# Patient Record
Sex: Female | Born: 1945 | Race: White | Hispanic: No | Marital: Single | Smoking: Never smoker
Health system: Southern US, Community
[De-identification: ages and names within clinical notes are randomized; demographics above are authoritative.]

## PROBLEM LIST (undated history)

## (undated) DIAGNOSIS — E785 Hyperlipidemia, unspecified: Secondary | ICD-10-CM

## (undated) DIAGNOSIS — E039 Hypothyroidism, unspecified: Secondary | ICD-10-CM

## (undated) DIAGNOSIS — I10 Essential (primary) hypertension: Secondary | ICD-10-CM

---

## 2021-07-25 ENCOUNTER — Other Ambulatory Visit: Payer: Self-pay | Admitting: Internal Medicine

## 2021-07-25 DIAGNOSIS — R1032 Left lower quadrant pain: Secondary | ICD-10-CM

## 2021-07-27 ENCOUNTER — Ambulatory Visit
Admission: RE | Admit: 2021-07-27 | Discharge: 2021-07-27 | Disposition: A | Discharge status: Home / Self Care | Payer: Medicare PPO | Source: Ambulatory Visit | Attending: Internal Medicine | Admitting: Internal Medicine

## 2021-07-27 DIAGNOSIS — R1032 Left lower quadrant pain: Secondary | ICD-10-CM

## 2021-07-28 ENCOUNTER — Other Ambulatory Visit: Payer: Self-pay | Admitting: Internal Medicine

## 2021-07-28 DIAGNOSIS — R1030 Lower abdominal pain, unspecified: Secondary | ICD-10-CM

## 2021-08-02 ENCOUNTER — Inpatient Hospital Stay: Admission: RE | Admit: 2021-08-02 | Payer: Medicare PPO | Source: Ambulatory Visit

## 2021-08-04 ENCOUNTER — Ambulatory Visit
Admission: RE | Admit: 2021-08-04 | Discharge: 2021-08-04 | Disposition: A | Discharge status: Home / Self Care | Payer: Medicare PPO | Source: Ambulatory Visit | Attending: Internal Medicine | Admitting: Internal Medicine

## 2021-08-04 DIAGNOSIS — R1030 Lower abdominal pain, unspecified: Secondary | ICD-10-CM

## 2021-08-04 MED ORDER — IOPAMIDOL (ISOVUE-300) INJECTION 61%
100.0000 mL | Freq: Once | INTRAVENOUS | Status: AC | PRN
  Administered 2021-08-04: 100 mL via INTRAVENOUS

## 2021-08-19 ENCOUNTER — Other Ambulatory Visit: Payer: Medicare PPO

## 2022-01-30 DIAGNOSIS — G4719 Other hypersomnia: Secondary | ICD-10-CM | POA: Diagnosis not present

## 2022-01-30 DIAGNOSIS — E039 Hypothyroidism, unspecified: Secondary | ICD-10-CM | POA: Diagnosis not present

## 2022-02-09 DIAGNOSIS — H25812 Combined forms of age-related cataract, left eye: Secondary | ICD-10-CM | POA: Diagnosis not present

## 2022-02-09 DIAGNOSIS — H2512 Age-related nuclear cataract, left eye: Secondary | ICD-10-CM | POA: Diagnosis not present

## 2022-02-09 DIAGNOSIS — H269 Unspecified cataract: Secondary | ICD-10-CM | POA: Diagnosis not present

## 2022-02-09 DIAGNOSIS — H25012 Cortical age-related cataract, left eye: Secondary | ICD-10-CM | POA: Diagnosis not present

## 2022-02-09 DIAGNOSIS — H52222 Regular astigmatism, left eye: Secondary | ICD-10-CM | POA: Diagnosis not present

## 2022-02-09 DIAGNOSIS — H25042 Posterior subcapsular polar age-related cataract, left eye: Secondary | ICD-10-CM | POA: Diagnosis not present

## 2022-02-14 DIAGNOSIS — E039 Hypothyroidism, unspecified: Secondary | ICD-10-CM | POA: Diagnosis not present

## 2022-02-14 DIAGNOSIS — E7800 Pure hypercholesterolemia, unspecified: Secondary | ICD-10-CM | POA: Diagnosis not present

## 2022-02-17 DIAGNOSIS — Z961 Presence of intraocular lens: Secondary | ICD-10-CM | POA: Diagnosis not present

## 2022-02-17 DIAGNOSIS — H353122 Nonexudative age-related macular degeneration, left eye, intermediate dry stage: Secondary | ICD-10-CM | POA: Diagnosis not present

## 2022-02-28 DIAGNOSIS — F4321 Adjustment disorder with depressed mood: Secondary | ICD-10-CM | POA: Diagnosis not present

## 2022-03-06 DIAGNOSIS — E559 Vitamin D deficiency, unspecified: Secondary | ICD-10-CM | POA: Diagnosis not present

## 2022-03-06 DIAGNOSIS — K219 Gastro-esophageal reflux disease without esophagitis: Secondary | ICD-10-CM | POA: Diagnosis not present

## 2022-03-06 DIAGNOSIS — I10 Essential (primary) hypertension: Secondary | ICD-10-CM | POA: Diagnosis not present

## 2022-03-06 DIAGNOSIS — M810 Age-related osteoporosis without current pathological fracture: Secondary | ICD-10-CM | POA: Diagnosis not present

## 2022-03-06 DIAGNOSIS — E039 Hypothyroidism, unspecified: Secondary | ICD-10-CM | POA: Diagnosis not present

## 2022-03-07 DIAGNOSIS — G4733 Obstructive sleep apnea (adult) (pediatric): Secondary | ICD-10-CM | POA: Diagnosis not present

## 2022-03-08 DIAGNOSIS — G4733 Obstructive sleep apnea (adult) (pediatric): Secondary | ICD-10-CM | POA: Diagnosis not present

## 2022-03-09 DIAGNOSIS — H269 Unspecified cataract: Secondary | ICD-10-CM | POA: Diagnosis not present

## 2022-03-09 DIAGNOSIS — H25011 Cortical age-related cataract, right eye: Secondary | ICD-10-CM | POA: Diagnosis not present

## 2022-03-09 DIAGNOSIS — H25811 Combined forms of age-related cataract, right eye: Secondary | ICD-10-CM | POA: Diagnosis not present

## 2022-03-09 DIAGNOSIS — H2511 Age-related nuclear cataract, right eye: Secondary | ICD-10-CM | POA: Diagnosis not present

## 2022-03-29 ENCOUNTER — Other Ambulatory Visit: Payer: Self-pay

## 2022-03-29 ENCOUNTER — Emergency Department (HOSPITAL_BASED_OUTPATIENT_CLINIC_OR_DEPARTMENT_OTHER)
Admission: EM | Admit: 2022-03-29 | Discharge: 2022-03-29 | Disposition: A | Discharge status: Home / Self Care | Payer: Medicare PPO | Attending: Emergency Medicine | Admitting: Emergency Medicine

## 2022-03-29 ENCOUNTER — Emergency Department (HOSPITAL_BASED_OUTPATIENT_CLINIC_OR_DEPARTMENT_OTHER): Payer: Medicare PPO | Admitting: Radiology

## 2022-03-29 ENCOUNTER — Encounter (HOSPITAL_BASED_OUTPATIENT_CLINIC_OR_DEPARTMENT_OTHER): Payer: Self-pay | Admitting: Emergency Medicine

## 2022-03-29 DIAGNOSIS — R0789 Other chest pain: Secondary | ICD-10-CM | POA: Diagnosis not present

## 2022-03-29 DIAGNOSIS — R079 Chest pain, unspecified: Secondary | ICD-10-CM

## 2022-03-29 DIAGNOSIS — Z20822 Contact with and (suspected) exposure to covid-19: Secondary | ICD-10-CM | POA: Diagnosis not present

## 2022-03-29 DIAGNOSIS — K449 Diaphragmatic hernia without obstruction or gangrene: Secondary | ICD-10-CM | POA: Diagnosis not present

## 2022-03-29 DIAGNOSIS — B0052 Herpesviral keratitis: Secondary | ICD-10-CM | POA: Diagnosis not present

## 2022-03-29 DIAGNOSIS — H16041 Marginal corneal ulcer, right eye: Secondary | ICD-10-CM | POA: Diagnosis not present

## 2022-03-29 LAB — CBC
HCT: 37.5 % (ref 36.0–46.0)
Hemoglobin: 11.9 g/dL — ABNORMAL LOW (ref 12.0–15.0)
MCH: 28.6 pg (ref 26.0–34.0)
MCHC: 31.7 g/dL (ref 30.0–36.0)
MCV: 90.1 fL (ref 80.0–100.0)
Platelets: 327 10*3/uL (ref 150–400)
RBC: 4.16 MIL/uL (ref 3.87–5.11)
RDW: 15.3 % (ref 11.5–15.5)
WBC: 9.5 10*3/uL (ref 4.0–10.5)
nRBC: 0 % (ref 0.0–0.2)

## 2022-03-29 LAB — TROPONIN I (HIGH SENSITIVITY)
Troponin I (High Sensitivity): 3 ng/L (ref <18)
Troponin I (High Sensitivity): 4 ng/L (ref <18)

## 2022-03-29 LAB — RESP PANEL BY RT-PCR (RSV, FLU A&B, COVID)  RVPGX2
Influenza A by PCR: NEGATIVE
Influenza B by PCR: NEGATIVE
Resp Syncytial Virus by PCR: NEGATIVE
SARS Coronavirus 2 by RT PCR: NEGATIVE

## 2022-03-29 LAB — BASIC METABOLIC PANEL
Anion gap: 10 (ref 5–15)
BUN: 17 mg/dL (ref 8–23)
CO2: 27 mmol/L (ref 22–32)
Calcium: 9.7 mg/dL (ref 8.9–10.3)
Chloride: 102 mmol/L (ref 98–111)
Creatinine, Ser: 0.94 mg/dL (ref 0.44–1.00)
GFR, Estimated: >60 mL/min (ref >60)
Glucose, Bld: 127 mg/dL — ABNORMAL HIGH (ref 70–99)
Potassium: 3.8 mmol/L (ref 3.5–5.1)
Sodium: 139 mmol/L (ref 135–145)

## 2022-03-29 NOTE — ED Triage Notes (Signed)
Pt arrives to ED with c/o chest pains that started 30 minutes ago while she was at rest. She notes generalized chest pain with upper abdominal pains.

## 2022-03-29 NOTE — Discharge Instructions (Signed)
You were seen in the emergency department for evaluation of chest pain.  You had blood work EKG and chest x-ray that did not show any evidence of heart attack.  You did have a hiatal hernia which may be a cause of your symptoms.  Please continue your antacid medication and follow-up with your primary care doctor and GI doctor.  Return to the emergency department if any worsening or concerning symptoms

## 2022-03-29 NOTE — ED Provider Notes (Signed)
[facility] EMERGENCY DEPT Provider Note   CSN: [id number] Arrival date & time: 03/29/22  1037     History  Chief Complaint  Patient presents with   Chest Pain    Suzanne Kirby is a 76 y.o. female.  She has no prior cardiac history.  She said she was sitting at the kitchen table when she began experiencing pain across to her chest and her upper abdomen.  She said it was severe and constant and lasted about an hour.  It is fully gone now.  It was not associated with shortness of breath nausea vomiting diaphoresis dizziness.  She said she had a similar pain about 20 years ago and they took out her gallbladder.  Non-smoker.  She did try some reflux medicine which may have caused some improvement.  She just had cataract surgery a few weeks ago and her right eye is very light sensitive  The history is provided by the patient.  Chest Pain Pain location:  L chest and R chest Pain quality: aching   Pain radiates to:  Epigastrium Pain severity:  Severe Onset quality:  Sudden Duration:  1 hour Timing:  Constant Progression:  Resolved Chronicity:  New Relieved by:  Antacids Worsened by:  Nothing Ineffective treatments:  None tried Associated symptoms: abdominal pain   Associated symptoms: no cough, no diaphoresis, no headache, no heartburn, no nausea, no numbness, no palpitations, no shortness of breath, no vomiting and no weakness        Home Medications Prior to Admission medications   Not on File      Allergies    Sulfa antibiotics    Review of Systems   Review of Systems  Constitutional:  Negative for diaphoresis.  Eyes:  Positive for pain and redness.  Respiratory:  Negative for cough and shortness of breath.   Cardiovascular:  Positive for chest pain. Negative for palpitations.  Gastrointestinal:  Positive for abdominal pain. Negative for heartburn, nausea and vomiting.  Neurological:  Negative for weakness, numbness and headaches.    Physical  Exam Updated Vital Signs BP (!) 176/88   Pulse 81   Temp 98 F (36.7 C) (Oral)   Resp 18   Ht 5\' 1"  (1.549 m)   Wt 69.4 kg   SpO2 99%   BMI 28.91 kg/m  Physical Exam Vitals and nursing note reviewed.  Constitutional:      General: She is not in acute distress.    Appearance: She is well-developed.  HENT:     Head: Normocephalic and atraumatic.  Eyes:     Conjunctiva/sclera: Conjunctivae normal.  Cardiovascular:     Rate and Rhythm: Normal rate and regular rhythm.     Heart sounds: Normal heart sounds. No murmur heard. Pulmonary:     Effort: Pulmonary effort is normal. No respiratory distress.     Breath sounds: Normal breath sounds.  Abdominal:     Palpations: Abdomen is soft.     Tenderness: There is no abdominal tenderness.  Musculoskeletal:        General: No swelling. Normal range of motion.     Cervical back: Neck supple.     Right lower leg: No tenderness. No edema.     Left lower leg: No tenderness. No edema.  Skin:    General: Skin is warm and dry.     Capillary Refill: Capillary refill takes less than 2 seconds.  Neurological:     General: No focal deficit present.     Mental Status: She  is alert.     ED Results / Procedures / Treatments   Labs (all labs ordered are listed, but only abnormal results are displayed) Labs Reviewed  BASIC METABOLIC PANEL - Abnormal; Notable for the following components:      Result Value   Glucose, Bld 127 (*)    All other components within normal limits  CBC - Abnormal; Notable for the following components:   Hemoglobin 11.9 (*)    All other components within normal limits  RESP PANEL BY RT-PCR (RSV, FLU A&B, COVID)  [id number]  TROPONIN I (HIGH SENSITIVITY)  TROPONIN I (HIGH SENSITIVITY)    EKG EKG Interpretation  Date/Time:  Wednesday March 29 2022 10:45:38 EST Ventricular Rate:  76 PR Interval:  158 QRS Duration: 76 QT Interval:  426 QTC Calculation: 479 R Axis:   66 Text Interpretation: Normal sinus  rhythm Cannot rule out Anterior infarct , age undetermined Abnormal ECG No previous ECGs available Confirmed by Meridee Score [phone]) on 03/29/2022 10:47:49 AM  Radiology DG Chest 2 View  Result Date: 03/29/2022 CLINICAL DATA:  Chest pain EXAM: CHEST - 2 VIEW COMPARISON:  None available FINDINGS: Heart size within normal limits. Aortic atherosclerosis. Moderate-sized hiatal hernia. No focal airspace consolidation, pleural effusion, or pneumothorax. IMPRESSION: 1. No active cardiopulmonary disease. 2. Moderate-sized hiatal hernia. Electronically Signed   By: Duanne Guess D.O.   On: 03/29/2022 11:11    Procedures Procedures    Medications Ordered in ED Medications - No data to display  ED Course/ Medical Decision Making/ A&P Clinical Course as of 03/29/22 1806  Wed Mar 29, 2022  1457 Patient's delta troponin is negative.  Her chest x-ray did show a hiatal hernia and I reviewed this with the patient.  This is the first she is hearing at that but she has been troubled by reflux.  Possible cause of her symptoms.  Asked her to follow this up with her GI doctor. [MB]    Clinical Course User Index [MB] Terrilee Files, MD                           Medical Decision Making Amount and/or Complexity of Data Reviewed Labs: ordered. Radiology: ordered.   This patient complains of chest pain; this involves an extensive number of treatment Options and is a complaint that carries with it a high risk of complications and morbidity. The differential includes ACS, pneumonia, pneumothorax, PE, vascular, reflux  I ordered, reviewed and interpreted labs, which included CBC with normal white count, hemoglobin slightly low, chemistries with mildly elevated glucose, COVID and flu negative, troponins flat I ordered imaging studies which included chest x-ray and I independently    visualized and interpreted imaging which showed no acute findings, does have incidental hiatal hernia Additional history  obtained from patient's niece Previous records obtained and reviewed in epic no recent admissions Cardiac monitoring reviewed, normal sinus rhythm Social determinants considered, no significant barriers Critical Interventions: None  After the interventions stated above, I reevaluated the patient and found patient to be having no pain and no distress with stable vitals Admission and further testing considered, no indications for admission or further workup at this time.  Recommended continued management of her reflux symptoms and close follow-up with PCP.  Return instructions discussed         Final Clinical Impression(s) / ED Diagnoses Final diagnoses:  Nonspecific chest pain  Hiatal hernia    Rx / DC Orders ED Discharge  Orders     None         Terrilee Files, MD 03/29/22 1807

## 2022-03-31 DIAGNOSIS — H16041 Marginal corneal ulcer, right eye: Secondary | ICD-10-CM | POA: Diagnosis not present

## 2022-04-02 DIAGNOSIS — H16041 Marginal corneal ulcer, right eye: Secondary | ICD-10-CM | POA: Diagnosis not present

## 2022-04-11 DIAGNOSIS — E559 Vitamin D deficiency, unspecified: Secondary | ICD-10-CM | POA: Diagnosis not present

## 2022-04-11 DIAGNOSIS — K449 Diaphragmatic hernia without obstruction or gangrene: Secondary | ICD-10-CM | POA: Diagnosis not present

## 2022-04-11 DIAGNOSIS — I10 Essential (primary) hypertension: Secondary | ICD-10-CM | POA: Diagnosis not present

## 2022-04-11 DIAGNOSIS — M810 Age-related osteoporosis without current pathological fracture: Secondary | ICD-10-CM | POA: Diagnosis not present

## 2022-04-11 DIAGNOSIS — E039 Hypothyroidism, unspecified: Secondary | ICD-10-CM | POA: Diagnosis not present

## 2022-04-11 DIAGNOSIS — M25562 Pain in left knee: Secondary | ICD-10-CM | POA: Diagnosis not present

## 2022-04-11 DIAGNOSIS — M25561 Pain in right knee: Secondary | ICD-10-CM | POA: Diagnosis not present

## 2022-04-12 DIAGNOSIS — F4321 Adjustment disorder with depressed mood: Secondary | ICD-10-CM | POA: Diagnosis not present

## 2022-04-24 DIAGNOSIS — G4733 Obstructive sleep apnea (adult) (pediatric): Secondary | ICD-10-CM | POA: Diagnosis not present

## 2022-04-24 DIAGNOSIS — R400 Somnolence: Secondary | ICD-10-CM | POA: Diagnosis not present

## 2022-04-25 DIAGNOSIS — F4321 Adjustment disorder with depressed mood: Secondary | ICD-10-CM | POA: Diagnosis not present

## 2022-05-02 DIAGNOSIS — F4321 Adjustment disorder with depressed mood: Secondary | ICD-10-CM | POA: Diagnosis not present

## 2022-05-05 DIAGNOSIS — Z961 Presence of intraocular lens: Secondary | ICD-10-CM | POA: Diagnosis not present

## 2022-05-05 DIAGNOSIS — H1789 Other corneal scars and opacities: Secondary | ICD-10-CM | POA: Diagnosis not present

## 2022-05-05 DIAGNOSIS — H04123 Dry eye syndrome of bilateral lacrimal glands: Secondary | ICD-10-CM | POA: Diagnosis not present

## 2022-05-05 DIAGNOSIS — B0052 Herpesviral keratitis: Secondary | ICD-10-CM | POA: Diagnosis not present

## 2022-05-09 DIAGNOSIS — F4321 Adjustment disorder with depressed mood: Secondary | ICD-10-CM | POA: Diagnosis not present

## 2022-05-11 DIAGNOSIS — G4733 Obstructive sleep apnea (adult) (pediatric): Secondary | ICD-10-CM | POA: Diagnosis not present

## 2022-05-11 DIAGNOSIS — Z411 Encounter for cosmetic surgery: Secondary | ICD-10-CM | POA: Diagnosis not present

## 2022-05-11 DIAGNOSIS — L219 Seborrheic dermatitis, unspecified: Secondary | ICD-10-CM | POA: Diagnosis not present

## 2022-05-11 DIAGNOSIS — I10 Essential (primary) hypertension: Secondary | ICD-10-CM | POA: Diagnosis not present

## 2022-05-16 DIAGNOSIS — F4321 Adjustment disorder with depressed mood: Secondary | ICD-10-CM | POA: Diagnosis not present

## 2022-05-25 DIAGNOSIS — R400 Somnolence: Secondary | ICD-10-CM | POA: Diagnosis not present

## 2022-05-25 DIAGNOSIS — F4321 Adjustment disorder with depressed mood: Secondary | ICD-10-CM | POA: Diagnosis not present

## 2022-05-25 DIAGNOSIS — G4733 Obstructive sleep apnea (adult) (pediatric): Secondary | ICD-10-CM | POA: Diagnosis not present

## 2022-06-01 DIAGNOSIS — F4321 Adjustment disorder with depressed mood: Secondary | ICD-10-CM | POA: Diagnosis not present

## 2022-06-02 ENCOUNTER — Telehealth: Payer: Self-pay

## 2022-06-02 DIAGNOSIS — K219 Gastro-esophageal reflux disease without esophagitis: Secondary | ICD-10-CM | POA: Diagnosis not present

## 2022-06-02 NOTE — Telephone Encounter (Signed)
Pt called requesting a new patient appointment to see Dr Cruzita Lederer. She lvm on clinical vm requesting a call back to follow up on referral that was sent a month ago.

## 2022-06-06 DIAGNOSIS — M25561 Pain in right knee: Secondary | ICD-10-CM | POA: Diagnosis not present

## 2022-06-08 DIAGNOSIS — L820 Inflamed seborrheic keratosis: Secondary | ICD-10-CM | POA: Diagnosis not present

## 2022-06-13 DIAGNOSIS — K9089 Other intestinal malabsorption: Secondary | ICD-10-CM | POA: Diagnosis not present

## 2022-06-13 DIAGNOSIS — E559 Vitamin D deficiency, unspecified: Secondary | ICD-10-CM | POA: Diagnosis not present

## 2022-06-13 DIAGNOSIS — F4321 Adjustment disorder with depressed mood: Secondary | ICD-10-CM | POA: Diagnosis not present

## 2022-06-22 DIAGNOSIS — I10 Essential (primary) hypertension: Secondary | ICD-10-CM | POA: Diagnosis not present

## 2022-06-22 DIAGNOSIS — G4733 Obstructive sleep apnea (adult) (pediatric): Secondary | ICD-10-CM | POA: Diagnosis not present

## 2022-06-23 DIAGNOSIS — R400 Somnolence: Secondary | ICD-10-CM | POA: Diagnosis not present

## 2022-06-23 DIAGNOSIS — G4733 Obstructive sleep apnea (adult) (pediatric): Secondary | ICD-10-CM | POA: Diagnosis not present

## 2022-07-05 DIAGNOSIS — F4321 Adjustment disorder with depressed mood: Secondary | ICD-10-CM | POA: Diagnosis not present

## 2022-07-12 DIAGNOSIS — F4321 Adjustment disorder with depressed mood: Secondary | ICD-10-CM | POA: Diagnosis not present

## 2022-07-19 DIAGNOSIS — F4321 Adjustment disorder with depressed mood: Secondary | ICD-10-CM | POA: Diagnosis not present

## 2022-07-24 DIAGNOSIS — R400 Somnolence: Secondary | ICD-10-CM | POA: Diagnosis not present

## 2022-07-24 DIAGNOSIS — G4733 Obstructive sleep apnea (adult) (pediatric): Secondary | ICD-10-CM | POA: Diagnosis not present

## 2022-07-27 ENCOUNTER — Emergency Department (HOSPITAL_BASED_OUTPATIENT_CLINIC_OR_DEPARTMENT_OTHER): Payer: Medicare PPO

## 2022-07-27 ENCOUNTER — Observation Stay (HOSPITAL_BASED_OUTPATIENT_CLINIC_OR_DEPARTMENT_OTHER)
Admission: EM | Admit: 2022-07-27 | Discharge: 2022-07-28 | Disposition: A | Discharge status: Home / Self Care | Payer: Medicare PPO | Attending: Student | Admitting: Student

## 2022-07-27 ENCOUNTER — Encounter (HOSPITAL_BASED_OUTPATIENT_CLINIC_OR_DEPARTMENT_OTHER): Payer: Self-pay | Admitting: Radiology

## 2022-07-27 ENCOUNTER — Observation Stay (HOSPITAL_COMMUNITY): Payer: Medicare PPO

## 2022-07-27 ENCOUNTER — Emergency Department (HOSPITAL_BASED_OUTPATIENT_CLINIC_OR_DEPARTMENT_OTHER): Payer: Medicare PPO | Admitting: Radiology

## 2022-07-27 ENCOUNTER — Other Ambulatory Visit: Payer: Self-pay

## 2022-07-27 DIAGNOSIS — I6603 Occlusion and stenosis of bilateral middle cerebral arteries: Secondary | ICD-10-CM | POA: Diagnosis not present

## 2022-07-27 DIAGNOSIS — I6381 Other cerebral infarction due to occlusion or stenosis of small artery: Secondary | ICD-10-CM | POA: Diagnosis not present

## 2022-07-27 DIAGNOSIS — I10 Essential (primary) hypertension: Secondary | ICD-10-CM | POA: Diagnosis not present

## 2022-07-27 DIAGNOSIS — E039 Hypothyroidism, unspecified: Secondary | ICD-10-CM | POA: Insufficient documentation

## 2022-07-27 DIAGNOSIS — R2990 Unspecified symptoms and signs involving the nervous system: Secondary | ICD-10-CM | POA: Diagnosis not present

## 2022-07-27 DIAGNOSIS — R200 Anesthesia of skin: Principal | ICD-10-CM | POA: Insufficient documentation

## 2022-07-27 DIAGNOSIS — I669 Occlusion and stenosis of unspecified cerebral artery: Secondary | ICD-10-CM | POA: Diagnosis not present

## 2022-07-27 DIAGNOSIS — Z79899 Other long term (current) drug therapy: Secondary | ICD-10-CM | POA: Insufficient documentation

## 2022-07-27 DIAGNOSIS — I6300 Cerebral infarction due to thrombosis of unspecified precerebral artery: Secondary | ICD-10-CM | POA: Diagnosis not present

## 2022-07-27 DIAGNOSIS — K449 Diaphragmatic hernia without obstruction or gangrene: Secondary | ICD-10-CM | POA: Diagnosis not present

## 2022-07-27 DIAGNOSIS — I359 Nonrheumatic aortic valve disorder, unspecified: Secondary | ICD-10-CM | POA: Diagnosis not present

## 2022-07-27 DIAGNOSIS — G459 Transient cerebral ischemic attack, unspecified: Secondary | ICD-10-CM | POA: Diagnosis not present

## 2022-07-27 DIAGNOSIS — I358 Other nonrheumatic aortic valve disorders: Secondary | ICD-10-CM | POA: Insufficient documentation

## 2022-07-27 DIAGNOSIS — E785 Hyperlipidemia, unspecified: Secondary | ICD-10-CM

## 2022-07-27 LAB — ETHANOL: Alcohol, Ethyl (B): <10 mg/dL (ref <10)

## 2022-07-27 LAB — CBG MONITORING, ED: Glucose-Capillary: 101 mg/dL — ABNORMAL HIGH (ref 70–99)

## 2022-07-27 LAB — DIFFERENTIAL
Abs Immature Granulocytes: 0.02 10*3/uL (ref 0.00–0.07)
Basophils Absolute: 0 10*3/uL (ref 0.0–0.1)
Basophils Relative: 0 %
Eosinophils Absolute: 0.4 10*3/uL (ref 0.0–0.5)
Eosinophils Relative: 5 %
Immature Granulocytes: 0 %
Lymphocytes Relative: 33 %
Lymphs Abs: 2.7 10*3/uL (ref 0.7–4.0)
Monocytes Absolute: 1.1 10*3/uL — ABNORMAL HIGH (ref 0.1–1.0)
Monocytes Relative: 14 %
Neutro Abs: 3.9 10*3/uL (ref 1.7–7.7)
Neutrophils Relative %: 48 %

## 2022-07-27 LAB — COMPREHENSIVE METABOLIC PANEL
ALT: 14 U/L (ref 0–44)
AST: 23 U/L (ref 15–41)
Albumin: 3.9 g/dL (ref 3.5–5.0)
Alkaline Phosphatase: 109 U/L (ref 38–126)
Anion gap: 7 (ref 5–15)
BUN: 21 mg/dL (ref 8–23)
CO2: 26 mmol/L (ref 22–32)
Calcium: 9.5 mg/dL (ref 8.9–10.3)
Chloride: 106 mmol/L (ref 98–111)
Creatinine, Ser: 1.01 mg/dL — ABNORMAL HIGH (ref 0.44–1.00)
GFR, Estimated: 57 mL/min — ABNORMAL LOW (ref >60)
Glucose, Bld: 109 mg/dL — ABNORMAL HIGH (ref 70–99)
Potassium: 4.2 mmol/L (ref 3.5–5.1)
Sodium: 139 mmol/L (ref 135–145)
Total Bilirubin: 0.6 mg/dL (ref 0.3–1.2)
Total Protein: 7.6 g/dL (ref 6.5–8.1)

## 2022-07-27 LAB — CBC
HCT: 37.9 % (ref 36.0–46.0)
Hemoglobin: 11.7 g/dL — ABNORMAL LOW (ref 12.0–15.0)
MCH: 28.7 pg (ref 26.0–34.0)
MCHC: 30.9 g/dL (ref 30.0–36.0)
MCV: 93.1 fL (ref 80.0–100.0)
Platelets: 308 10*3/uL (ref 150–400)
RBC: 4.07 MIL/uL (ref 3.87–5.11)
RDW: 14 % (ref 11.5–15.5)
WBC: 8.1 10*3/uL (ref 4.0–10.5)
nRBC: 0 % (ref 0.0–0.2)

## 2022-07-27 LAB — APTT: aPTT: 29 seconds (ref 24–36)

## 2022-07-27 LAB — PROTIME-INR
INR: 1 (ref 0.8–1.2)
Prothrombin Time: 13.1 seconds (ref 11.4–15.2)

## 2022-07-27 MED ORDER — CLOPIDOGREL BISULFATE 75 MG PO TABS
75.0000 mg | ORAL_TABLET | Freq: Every day | ORAL | Status: DC
  Administered 2022-07-27 – 2022-07-28 (×2): 75 mg via ORAL
  Filled 2022-07-27 (×2): qty 1

## 2022-07-27 MED ORDER — STROKE: EARLY STAGES OF RECOVERY BOOK
Freq: Once | Status: AC
  Filled 2022-07-27: qty 1

## 2022-07-27 MED ORDER — ASPIRIN 81 MG PO TBEC
81.0000 mg | DELAYED_RELEASE_TABLET | Freq: Every day | ORAL | Status: DC
  Administered 2022-07-27 – 2022-07-28 (×2): 81 mg via ORAL
  Filled 2022-07-27 (×2): qty 1

## 2022-07-27 MED ORDER — IOHEXOL 350 MG/ML SOLN
100.0000 mL | Freq: Once | INTRAVENOUS | Status: AC | PRN
  Administered 2022-07-27: 75 mL via INTRAVENOUS

## 2022-07-27 MED ORDER — SODIUM CHLORIDE 0.9% FLUSH: 3.0000 mL | Freq: Once | INTRAVENOUS | Status: DC

## 2022-07-27 NOTE — ED Notes (Signed)
Per neurology, CT angio ordered, pt to be transferred to [facility] for MRI

## 2022-07-27 NOTE — ED Notes (Signed)
Patient transported to X-ray 

## 2022-07-27 NOTE — ED Notes (Signed)
[facility] at bedside to transport pt to [place] 

## 2022-07-27 NOTE — Progress Notes (Signed)
The patient is admitted to [facility] 3 W from [facility]. A & O x 4. She denied any acute pain. Full assessment to epic completed with the help of virtual RN. Patient voiced no concern. Will continue to monitor.

## 2022-07-27 NOTE — Progress Notes (Signed)
[facility] Telemedicine Consult   Requesting Provider: Dr. Fredderick Phenix Consult Participants: Patient, bedside RN, [facility] RN Angi Location of the provider: [facility] Location of the patient: [facility]  This consult was provided via telemedicine with 2-way video and audio communication. The patient/family was informed that care would be provided in this way and agreed to receive care in this manner.    Chief Complaint: Right hand and foot tingling and numbness code stroke  HPI: Ms. Suzanne Kirby is a 77 year old pleasant Caucasian lady with past medical history of hypertension, hyperlipidemia, hypothyroidism and arthritis who developed sudden onset of right hand and foot tingling and numbness at 330 today.  She was out at a store when she noticed she had some trouble feeling her right fingers.  He also noticed numbness in the right foot the outer aspect.  She denies any accompanying symptoms in the form of headache, blurred vision, double vision, gait or balance or speech difficulties.  She denies any known prior history of strokes or TIAs or significant neurological problems.    LKW: 1530 tpa given?: No, symptoms to mild and resolving IR Thrombectomy? No, clinically does not seem like an LVO Modified Rankin Scale: 1-No significant post stroke disability and can perform usual duties with stroke symptoms Time of teleneurologist evaluation: 1645  Past medical history hypertension, hyperlipidemia, hypothyroidism, arthritis Home medications Synthroid, valsartan, Motrin, famotidine, atorvastatin  Exam: Vitals:   07/27/22 1631  BP: (!) 143/65  Pulse: 67  Resp: 18  Temp: 98.3 F (36.8 C)  SpO2: 100%    General: Pleasant elderly Caucasian lady not in distress.  1A: Level of Consciousness - 0 1B: Ask Month and Age - 0 1C: 'Blink Eyes' & 'Squeeze Hands' - 0 2: Test Horizontal Extraocular Movements - 0 3: Test Visual Fields - 0 4: Test Facial Palsy - 0 5A: Test  Left Arm Motor Drift - 0 5B: Test Right Arm Motor Drift - 0 6A: Test Left Leg Motor Drift - 0 6B: Test Right Leg Motor Drift - 0 7: Test Limb Ataxia - 0 8: Test Sensation - 0 9: Test Language/Aphasia- 0 10: Test Dysarthria - 0 11: Test Extinction/Inattention - 0  Pleasant elderly Caucasian lady not in distress.  She is awake alert oriented to time place and person.  Speech and language appear normal.  No dysarthria, aphasia or apraxia. Extraocular movements are full range without nystagmus.  Visual fields seem full to bedside confrontation testing.  Face is symmetric without weakness. Motor system exam shows symmetric upper and lower extremity strength without any drift or focal weakness. Touch and pinprick sensation are symmetric and equal bilaterally. Finger-to-nose and eatable coordination accurate. Gait not tested.  NIHSS score: 0 mild subjective paresthesias in the right hand and foot but no objective sensory loss Baseline modified Rankin score 1  Imaging Reviewed:  CT head noncontrast study shows no acute abnormalities.  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  Age-appropriate changes of chronic small vessel disease.  No early signs of acute stroke  Labs reviewed in epic and pertinent values follow:  CBG 101.  WBC normal   Assessment: 77 year old lady presents with sudden onset of right hand and foot paresthesias likely from small brainstem's or left subcortical infarct versus TIA. Her symptoms seem to be resolving and her deficits at present time and nondisabling hence will not offer thrombolysis with IV TNK.  Discussed risk benefit of TNK with the patient and  she is in agreement.  If she has neurologically worsening symptoms within the TNK window reactivate code stroke and consider thrombolysis. She is not a candidate for mechanical thrombectomy as symptoms are not consistent with LVO Stroke risk factors :  Hypertension, hyperlipidemia, silent strokes on MRI Recommendations:  Check MRI scan of the brain, echocardiogram, lipid profile, including A1c.Patient needs evaluation for acute stroke .she is not a candidate for thrombolysis or mechanical thrombectomy at the present time  Check MRI scan of the brain, echocardiogram, lipid profile, including A1c., CT angiogram of the brain and neck.   Patient needs brain MRI which is not available at [facility] at the present time hence patient will need to be transported to [facility].  Patient seems reluctant to be admitted to complete stroke workup hence if  MRI is negative for stroke she may be discharged with outpatient echocardiogram and stroke clinic follow-up.   If patient is willing to be admitted he can be transferred to [facility] to be admitted to the medical hospitalist service and then neurohospitalist team to consult today and stroke team to follow tomorrow Recommend aspirin and Plavix for 3 weeks followed by aspirin alone and aggressive risk factor modification. Case signed out to the neurohospitalist on-call to follow CT angiogram results and MRI. This patient is receiving care for possible acute neurological changes.  She is at risk for neurological worsening and recurrent strokes and needs close monitoring and ongoing stroke workup.  There was 60 minutes of care by this provider at the time of service, including time for direct evaluation via telemedicine, review of medical records, imaging studies and discussion of findings with providers, the patient and/or family.  Delia Heady, MD [facility] [phone]  If 7pm- 7am, please page neurology on call as listed in AMION.

## 2022-07-27 NOTE — ED Notes (Signed)
Patient transported to CT for angio. 

## 2022-07-27 NOTE — Plan of Care (Signed)
77 yo f came with Right hand and foot tingling and numbness code stroke  Hx of  hypertension, hyperlipidemia, hypothyroidism no prior hx of cva Tpa not given symptoms resolved  Seen Dr. Pearlean Brownie (telemedicine) Ct non acute : . Small age indeterminate lacunar infarcts in the left thalamus and left basal ganglia. MRI could better evaluate if clinically warranted.  CTA abnormal 1. 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 2. Moderate stenosis in a distal M2/proximal M3 segment of the left MCA. 3. Moderate to severe focal stenosis in a distal A2/proximal A3 segment of the right ACA. 4. No hemodynamically significant stenosis in the neck.  Needs admit MRI on arrival Pls consult neurology when she gets to [facility] Accepted as obs to [facility] tele bed Pls call flow manager on arrival 7:08 PM [name]

## 2022-07-27 NOTE — ED Provider Notes (Signed)
[place] EMERGENCY DEPARTMENT AT [facility] Provider Note   CSN: [id number] Arrival date & time: 07/27/22  1610     History  Chief Complaint  Patient presents with   Numbness    Suzanne Kirby is a 77 y.o. female.  Patient is a 77 year old female who presents with numbness in her hands and leg.  She says it started about 1 hour prior to arrival.  She denies any weakness.  She says it is in her right arm and right leg.  She had little bit of blurry vision earlier but denies any now.  This was in her right eye.  No double vision.  No speech deficits.  No balance difficulties.  No known prior history of stroke.  She has a prior history of hypertension and hyperlipidemia.       Home Medications Prior to Admission medications   Not on File      Allergies    Sulfa antibiotics    Review of Systems   Review of Systems  Constitutional:  Negative for chills, diaphoresis, fatigue and fever.  HENT:  Negative for congestion, rhinorrhea and sneezing.   Eyes: Negative.   Respiratory:  Negative for cough, chest tightness and shortness of breath.   Cardiovascular:  Negative for chest pain and leg swelling.  Gastrointestinal:  Negative for abdominal pain, blood in stool, diarrhea, nausea and vomiting.  Genitourinary:  Negative for difficulty urinating, flank pain, frequency and hematuria.  Musculoskeletal:  Negative for arthralgias and back pain.  Skin:  Negative for rash.  Neurological:  Positive for numbness. Negative for dizziness, speech difficulty, weakness and headaches.    Physical Exam Updated Vital Signs BP (!) 153/78   Pulse 62   Temp 98.3 F (36.8 C)   Resp 12   SpO2 99%  Physical Exam Constitutional:      Appearance: She is well-developed.  HENT:     Head: Normocephalic and atraumatic.  Eyes:     Pupils: Pupils are equal, round, and reactive to light.  Cardiovascular:     Rate and Rhythm: Normal rate and regular rhythm.     Heart sounds: Normal  heart sounds.  Pulmonary:     Effort: Pulmonary effort is normal. No respiratory distress.     Breath sounds: Normal breath sounds. No wheezing or rales.  Chest:     Chest wall: No tenderness.  Abdominal:     General: Bowel sounds are normal.     Palpations: Abdomen is soft.     Tenderness: There is no abdominal tenderness. There is no guarding or rebound.  Musculoskeletal:        General: Normal range of motion.     Cervical back: Normal range of motion and neck supple.  Lymphadenopathy:     Cervical: No cervical adenopathy.  Skin:    General: Skin is warm and dry.     Findings: No rash.  Neurological:     Mental Status: She is alert and oriented to person, place, and time.     Comments: Motor 5/5 all extremities Sensation grossly intact to LT all extremities Finger to Nose intact, no pronator drift CN II-XII grossly intact Gait normal      ED Results / Procedures / Treatments   Labs (all labs ordered are listed, but only abnormal results are displayed) Labs Reviewed  CBC - Abnormal; Notable for the following components:      Result Value   Hemoglobin 11.7 (*)    All other components within normal  limits  DIFFERENTIAL - Abnormal; Notable for the following components:   Monocytes Absolute 1.1 (*)    All other components within normal limits  COMPREHENSIVE METABOLIC PANEL - Abnormal; Notable for the following components:   Glucose, Bld 109 (*)    Creatinine, Ser 1.01 (*)    GFR, Estimated 57 (*)    All other components within normal limits  CBG MONITORING, ED - Abnormal; Notable for the following components:   Glucose-Capillary 101 (*)    All other components within normal limits  PROTIME-INR  APTT  ETHANOL    EKG None  Radiology CT ANGIO HEAD NECK W WO CM (CODE STROKE)  Result Date: 07/27/2022 CLINICAL DATA:  Transient ischemic attack (TIA) EXAM: CT ANGIOGRAPHY HEAD AND NECK WITH AND WITHOUT CONTRAST TECHNIQUE: Multidetector CT imaging of the head and neck  was performed using the standard protocol during bolus administration of intravenous contrast. Multiplanar CT image reconstructions and MIPs were obtained to evaluate the vascular anatomy. Carotid stenosis measurements (when applicable) are obtained utilizing NASCET criteria, using the distal internal carotid diameter as the denominator. RADIATION DOSE REDUCTION: This exam was performed according to the departmental dose-optimization program which includes automated exposure control, adjustment of the mA and/or kV according to patient size and/or use of iterative reconstruction technique. CONTRAST:  75mL OMNIPAQUE IOHEXOL 350 MG/ML SOLN COMPARISON:  Same day CT head FINDINGS: CT HEAD FINDINGS See same day CT brain for intracranial findings. CTA NECK FINDINGS Aortic arch: Four-vessel arch. Imaged portion shows no evidence of aneurysm or dissection. No significant stenosis of the major arch vessel origins. Right carotid system: No evidence of dissection, stenosis (50% or greater), or occlusion. Left carotid system: No evidence of dissection, stenosis (50% or greater), or occlusion. Vertebral arteries: Codominant. The origin of the left vertebral artery is poorly assessed due to streak artifact. No evidence of dissection, stenosis (50% or greater), or occlusion. Skeleton: Negative. Other neck: Negative. Upper chest: Negative. Review of the MIP images confirms the above findings CTA HEAD FINDINGS Anterior circulation: There is severe focal stenosis in the distal M2 segment of the right (series 9, image 62). Moderate stenosis in a distal M2/proximal M3 segment of the left MCA (series 100, image 64). Moderate to severe focal stenosis in a distal A2/proximal A3 segment of the right ACA (series 8, image 191). Posterior circulation:  Left P1 segment is small in caliber. Venous sinuses: As permitted by contrast timing, patent. Anatomic variants: None Review of the MIP images confirms the above findings IMPRESSION: 1. 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 2. Moderate stenosis in a distal M2/proximal M3 segment of the left MCA. 3. Moderate to severe focal stenosis in a distal A2/proximal A3 segment of the right ACA. 4. No hemodynamically significant stenosis in the neck. Electronically Signed   By: Lorenza Cambridge M.D.   On: 07/27/2022 18:15   CT HEAD CODE STROKE WO CONTRAST  Result Date: 07/27/2022 CLINICAL DATA:  Code stroke.  Neuro deficit, acute, stroke suspected EXAM: CT HEAD WITHOUT CONTRAST TECHNIQUE: Contiguous axial images were obtained from the base of the skull through the vertex without intravenous contrast. RADIATION DOSE REDUCTION: This exam was performed according to the departmental dose-optimization program which includes automated exposure control, adjustment of the mA and/or kV according to patient size and/or use of iterative reconstruction technique. COMPARISON:  None Available. FINDINGS: Brain: No evidence of acute large vascular territory infarction, hemorrhage, hydrocephalus, extra-axial collection or mass lesion/mass effect. Small age indeterminate  lacunar infarcts in the left thalamus and left basal ganglia. Remote perforator infarct in the right basal ganglia. Patchy white matter hypodensities, nonspecific but compatible with chronic microvascular ischemic disease. Vascular: Calcific atherosclerosis.  No hyperdense vessel. Skull: No acute fracture. Sinuses/Orbits: No acute finding. Other: No mastoid effusions. ASPECTS [facility] Stroke Program Early CT Score) total score (0-10 with 10 being normal): Probably 10. IMPRESSION: 1. Small age indeterminate lacunar infarcts in the left thalamus and left basal ganglia. MRI could better evaluate if clinically warranted. 2. No evidence of acute large vascular territory infarct or acute hemorrhage. Code stroke imaging results were communicated on 07/27/2022 at 4:56 pm to provider Zaccaro via telephone, who verbally acknowledged these results.  Electronically Signed   By: Feliberto Harts M.D.   On: 07/27/2022 16:59    Procedures Procedures    Medications Ordered in ED Medications  sodium chloride flush (NS) 0.9 % injection 3 mL (3 mLs Intravenous Not Given 07/27/22 1735)  iohexol (OMNIPAQUE) 350 MG/ML injection 100 mL (75 mLs Intravenous Contrast Given 07/27/22 1728)    ED Course/ Medical Decision Making/ A&P                             Medical Decision Making Amount and/or Complexity of Data Reviewed Labs: ordered. Radiology: ordered.  Risk Decision regarding hospitalization.   Patient is a 77 year old female who presents with some numbness in her right arm and leg.  This started an hour prior to arrival.  No other neurologic deficits were identified.  Code stroke was activated.  She had a head CT which shows some old infarcts but nothing definitively acute.  Labs reviewed and are nonconcerning.  Dr. Pearlean Brownie with the stroke team evaluated patient and recommended further evaluation such as MRI and echo.  Patient is agreeable to admission so we will plan hospitalist admission for those studies.  I spoke with Dr. Adela Glimpse who has accepted the patient for admission.  She at this on.  Final Clinical Impression(s) / ED Diagnoses Final diagnoses:  TIA (transient ischemic attack)    Rx / DC Orders ED Discharge Orders     None         Rolan Bucco, MD 07/27/22 1929

## 2022-07-27 NOTE — ED Notes (Signed)
Pt to CT with RN, IV started and labs sent.  Tele neurology activated

## 2022-07-27 NOTE — ED Notes (Signed)
1642 Cart activated with report provided by RN.  1644 Pt to CT 1646 Page to neuro 1649 Dr Pearlean Brownie joins cart

## 2022-07-28 ENCOUNTER — Observation Stay (HOSPITAL_BASED_OUTPATIENT_CLINIC_OR_DEPARTMENT_OTHER): Payer: Medicare PPO

## 2022-07-28 ENCOUNTER — Other Ambulatory Visit (HOSPITAL_COMMUNITY): Payer: Self-pay

## 2022-07-28 ENCOUNTER — Encounter (HOSPITAL_COMMUNITY): Payer: Self-pay | Admitting: Internal Medicine

## 2022-07-28 DIAGNOSIS — R2990 Unspecified symptoms and signs involving the nervous system: Secondary | ICD-10-CM | POA: Diagnosis not present

## 2022-07-28 DIAGNOSIS — G459 Transient cerebral ischemic attack, unspecified: Secondary | ICD-10-CM

## 2022-07-28 DIAGNOSIS — I669 Occlusion and stenosis of unspecified cerebral artery: Secondary | ICD-10-CM

## 2022-07-28 DIAGNOSIS — I6300 Cerebral infarction due to thrombosis of unspecified precerebral artery: Secondary | ICD-10-CM | POA: Diagnosis not present

## 2022-07-28 DIAGNOSIS — I358 Other nonrheumatic aortic valve disorders: Secondary | ICD-10-CM | POA: Insufficient documentation

## 2022-07-28 DIAGNOSIS — E039 Hypothyroidism, unspecified: Secondary | ICD-10-CM

## 2022-07-28 DIAGNOSIS — E785 Hyperlipidemia, unspecified: Secondary | ICD-10-CM

## 2022-07-28 DIAGNOSIS — I10 Essential (primary) hypertension: Secondary | ICD-10-CM

## 2022-07-28 DIAGNOSIS — I639 Cerebral infarction, unspecified: Secondary | ICD-10-CM | POA: Diagnosis not present

## 2022-07-28 LAB — ECHOCARDIOGRAM COMPLETE
AR max vel: 2.43 cm2
AV Area VTI: 2.52 cm2
AV Area mean vel: 2.44 cm2
AV Mean grad: 11 mmHg
AV Peak grad: 19.9 mmHg
Ao pk vel: 2.23 m/s
Area-P 1/2: 2.82 cm2
Height: 61 in
S' Lateral: 2.2 cm
Weight: 2761.92 oz

## 2022-07-28 LAB — LIPID PANEL
Cholesterol: 139 mg/dL (ref 0–200)
HDL: 26 mg/dL — ABNORMAL LOW (ref >40)
LDL Cholesterol: 92 mg/dL (ref 0–99)
Total CHOL/HDL Ratio: 5.3 RATIO
Triglycerides: 107 mg/dL (ref <150)
VLDL: 21 mg/dL (ref 0–40)

## 2022-07-28 LAB — HEMOGLOBIN A1C
Hgb A1c MFr Bld: 5.7 % — ABNORMAL HIGH (ref 4.8–5.6)
Mean Plasma Glucose: 116.89 mg/dL

## 2022-07-28 LAB — TSH: TSH: 1.772 u[IU]/mL (ref 0.350–4.500)

## 2022-07-28 MED ORDER — ATORVASTATIN CALCIUM 20 MG PO TABS: 20.0000 mg | ORAL_TABLET | Freq: Every day | ORAL | 1 refills | Status: DC

## 2022-07-28 MED ORDER — ACETAMINOPHEN 325 MG PO TABS: 650.0000 mg | ORAL_TABLET | Freq: Four times a day (QID) | ORAL | Status: DC | PRN

## 2022-07-28 MED ORDER — ENOXAPARIN SODIUM 40 MG/0.4ML IJ SOSY: 40.0000 mg | PREFILLED_SYRINGE | INTRAMUSCULAR | Status: DC

## 2022-07-28 MED ORDER — ACETAMINOPHEN 650 MG RE SUPP: 650.0000 mg | Freq: Four times a day (QID) | RECTAL | Status: DC | PRN

## 2022-07-28 MED ORDER — LEVOTHYROXINE SODIUM 75 MCG PO TABS
75.0000 ug | ORAL_TABLET | Freq: Every day | ORAL | Status: DC
  Administered 2022-07-28: 75 ug via ORAL
  Filled 2022-07-28: qty 1

## 2022-07-28 MED ORDER — ASPIRIN 81 MG PO TBEC: 81.0000 mg | DELAYED_RELEASE_TABLET | Freq: Every day | ORAL | 12 refills | Status: DC

## 2022-07-28 MED ORDER — CLOPIDOGREL BISULFATE 75 MG PO TABS: 75.0000 mg | ORAL_TABLET | Freq: Every day | ORAL | 0 refills | Status: AC

## 2022-07-28 NOTE — Evaluation (Signed)
Physical Therapy Evaluation & Discharge Patient Details Name: Suzanne Kirby MRN: [id number] DOB: 1945-05-29 Today's Date: 07/28/2022  History of Present Illness  Pt is a 77 y.o. female who presented 07/27/22 with acute onset R-sided numbness and tingling, which resolved in about one hour. Not a candidate for tPA. MRI negative for acute intracranial abnormality. Being worked up for TIA. PMH: HLD, HTN, hypothyroidism   Clinical Impression  Pt presents with condition above. PTA, she was independent and living alone in a 2-level house (she does not go upstairs) with 5 STE. Pt currently reports and appears to be back to her baseline, demonstrating symmetrical, WFL, and intact bil upper and lower extremity strength, sensation, and coordination along with [facility] cognition. Pt was able to perform all functional mobility safely without LOB, UE support, or assistance. All education completed and questions answered. PT will sign off.     Recommendations for follow up therapy are one component of a multi-disciplinary discharge planning process, led by the attending physician.  Recommendations may be updated based on patient status, additional functional criteria and insurance authorization.  Follow Up Recommendations       Assistance Recommended at Discharge None  Patient can return home with the following       Equipment Recommendations None recommended by PT  Recommendations for Other Services       Functional Status Assessment Patient has not had a recent decline in their functional status     Precautions / Restrictions Precautions Precautions: None Restrictions Weight Bearing Restrictions: No      Mobility  Bed Mobility               General bed mobility comments: Pt sitting EOB upon arrival    Transfers Overall transfer level: Independent Equipment used: None               General transfer comment: No assistance needed, no LOB    Ambulation/Gait Ambulation/Gait  assistance: Independent Gait Distance (Feet): 280 Feet Assistive device: None Gait Pattern/deviations: WFL(Within Functional Limits) Gait velocity: WFL Gait velocity interpretation: >4.37 ft/sec, indicative of normal walking speed   General Gait Details: Pt able to ambulate quickly and smoothly without LOB or assistance, even when cued to perform dynamic gait challenges, like weaving in and out of obstacles  Stairs Stairs: Yes Stairs assistance: Independent Stair Management: No rails, Alternating pattern, Forwards Number of Stairs: 2 General stair comments: Ascends and descends stairs without LOB or assistance  Wheelchair Mobility    Modified Rankin (Stroke Patients Only) Modified Rankin (Stroke Patients Only) Pre-Morbid Rankin Score: No symptoms Modified Rankin: No symptoms     Balance Overall balance assessment: No apparent balance deficits (not formally assessed)                                           Pertinent Vitals/Pain Pain Assessment Pain Assessment: No/denies pain    Home Living Family/patient expects to be discharged to:: Private residence Living Arrangements: Alone Available Help at Discharge: Family;Available PRN/intermittently (niece and nephew) Type of Home: House Home Access: Stairs to enter Entrance Stairs-Rails: Can reach both Entrance Stairs-Number of Steps: 5   Home Layout: Two level;Able to live on main level with bedroom/bathroom (does not use 2nd floor) Home Equipment: Shower seat - built in;Grab bars - tub/shower (grab bars are to be installed soon)      Prior Function Prior Level of Function :  Independent/Modified Independent;Driving             Mobility Comments: No AD, no falls ADLs Comments: Someone cleans her house for her 1x/month     Hand Dominance   Dominant Hand: Right    Extremity/Trunk Assessment   Upper Extremity Assessment Upper Extremity Assessment: Overall WFL for tasks assessed (MMT scores of  5 grossly bil, sensation and coordination intact bil)    Lower Extremity Assessment Lower Extremity Assessment: Overall WFL for tasks assessed (MMT scores of 5 grossly bil, sensation and coordination intact bil)    Cervical / Trunk Assessment Cervical / Trunk Assessment: Normal  Communication   Communication: No difficulties  Cognition Arousal/Alertness: Awake/alert Behavior During Therapy: WFL for tasks assessed/performed Overall Cognitive Status: Within Functional Limits for tasks assessed                                 General Comments: Discussing current medication dosage and time of last administration with someone on phone upon arrival, not seeming to have any noticable memory deficits. Appears and reports to be WFL/at baseline.        General Comments General comments (skin integrity, edema, etc.): Discussed signs/symptoms of a CVA    Exercises     Assessment/Plan    PT Assessment Patient does not need any further PT services  PT Problem List         PT Treatment Interventions      PT Goals (Current goals can be found in the Care Plan section)  Acute Rehab PT Goals Patient Stated Goal: to go home PT Goal Formulation: All assessment and education complete, DC therapy Time For Goal Achievement: 07/29/22 Potential to Achieve Goals: Good    Frequency       Co-evaluation               AM-PAC PT "6 Clicks" Mobility  Outcome Measure Help needed turning from your back to your side while in a flat bed without using bedrails?: None Help needed moving from lying on your back to sitting on the side of a flat bed without using bedrails?: None Help needed moving to and from a bed to a chair (including a wheelchair)?: None Help needed standing up from a chair using your arms (e.g., wheelchair or bedside chair)?: None Help needed to walk in hospital room?: None Help needed climbing 3-5 steps with a railing? : None 6 Click Score: 24    End of  Session Equipment Utilized During Treatment: Gait belt Activity Tolerance: Patient tolerated treatment well Patient left: in bed;with call bell/phone within reach Nurse Communication: Mobility status PT Visit Diagnosis: Other symptoms and signs involving the nervous system (R29.898)    Time: 1610-9604 PT Time Calculation (min) (ACUTE ONLY): 20 min   Charges:   PT Evaluation $PT Eval Low Complexity: 1 Low          Raymond Gurney, PT, DPT Acute Rehabilitation Services  Office: [phone]   Jewel Baize 07/28/2022, 8:24 AM

## 2022-07-28 NOTE — H&P (Signed)
History and Physical    Renly Guedes ZOX:[id number] DOB: Mar 17, 1946 DOA: 07/27/2022  PCP: Thana Ates, MD  Patient coming from: [facility] ED  Chief Complaint: Strokelike symptoms  HPI: Suzanne Kirby is a 77 y.o. female with medical history significant of hypertension, hyperlipidemia, hypothyroidism presented to the ED with acute onset right hand and right foot numbness and tingling, LKW 1530.  Seen by teleneurology and tPA not given as symptoms mild and resolved.  Not a candidate for mechanical thrombectomy as symptoms not consistent with LVO.  Labs showing mild stable anemia.  Chest x-ray showing moderate hiatal hernia and no active cardiopulmonary disease.  CT head without contrast: "IMPRESSION: 1. Small age indeterminate lacunar infarcts in the left thalamus and left basal ganglia. MRI could better evaluate if clinically warranted. 2. No evidence of acute large vascular territory infarct or acute hemorrhage."  CTA head and neck: "IMPRESSION: 1. 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 2. Moderate stenosis in a distal M2/proximal M3 segment of the left MCA. 3. Moderate to severe focal stenosis in a distal A2/proximal A3 segment of the right ACA. 4. No hemodynamically significant stenosis in the neck."  Patient received aspirin 81 mg and Plavix 75 mg in the ED.  Neurology recommended admission for stroke versus TIA workup.  Patient states yesterday 4/25 afternoon around 3:30 PM she was at a store shopping when all of a sudden her right hand became numb and she also had numbness and tingling of her right foot.  She was able to drive herself from the store to the ED and did not have any weakness in her arm or leg.  Denies vision changes or difficulty with speech.  She is not aware of having history of prior strokes.  Patient states her symptoms lasted for about an hour and resolved on arrival to the ED.  She has no other complaints.  Denies fevers, cough,  shortness of breath, chest pain, nausea, vomiting, abdominal pain, diarrhea, or any urinary symptoms.  Review of Systems:  Review of Systems  All other systems reviewed and are negative.   History reviewed. No pertinent past medical history.  History reviewed. No pertinent surgical history.   has no history on file for tobacco use, alcohol use, and drug use.  Allergies  Allergen Reactions   Sulfa Antibiotics     No family history on file.  Prior to Admission medications   Not on File    Physical Exam: Vitals:   07/27/22 2321 07/27/22 2338 07/28/22 0029 07/28/22 0033  BP: 136/72   (!) 167/69  Pulse: 79   68  Resp: 16   16  Temp: 98.4 F (36.9 C)   97.7 F (36.5 C)  TempSrc: Oral     SpO2: 100%   99%  Weight:   78.3 kg   Height:  5\' 1"  (1.549 m)      Physical Exam Vitals reviewed.  Constitutional:      General: She is not in acute distress. HENT:     Head: Normocephalic and atraumatic.  Eyes:     Extraocular Movements: Extraocular movements intact.  Cardiovascular:     Rate and Rhythm: Normal rate and regular rhythm.     Pulses: Normal pulses.  Pulmonary:     Effort: Pulmonary effort is normal. No respiratory distress.     Breath sounds: Normal breath sounds. No wheezing or rales.  Abdominal:     General: Bowel sounds are normal. There is no distension.  Palpations: Abdomen is soft.     Tenderness: There is no abdominal tenderness.  Musculoskeletal:     Cervical back: Normal range of motion.     Right lower leg: No edema.     Left lower leg: No edema.  Skin:    General: Skin is warm and dry.  Neurological:     General: No focal deficit present.     Mental Status: She is alert and oriented to person, place, and time.     Cranial Nerves: No cranial nerve deficit.     Sensory: No sensory deficit.     Motor: No weakness.     Labs on Admission: I have personally reviewed following labs and imaging studies  CBC: Recent Labs  Lab 07/27/22 1658   WBC 8.1  NEUTROABS 3.9  HGB 11.7*  HCT 37.9  MCV 93.1  PLT 308   Basic Metabolic Panel: Recent Labs  Lab 07/27/22 1658  NA 139  K 4.2  CL 106  CO2 26  GLUCOSE 109*  BUN 21  CREATININE 1.01*  CALCIUM 9.5   GFR: Estimated Creatinine Clearance: 43.5 mL/min (A) (by C-G formula based on SCr of 1.01 mg/dL (H)). Liver Function Tests: Recent Labs  Lab 07/27/22 1658  AST 23  ALT 14  ALKPHOS 109  BILITOT 0.6  PROT 7.6  ALBUMIN 3.9   No results for input(s): "LIPASE", "AMYLASE" in the last 168 hours. No results for input(s): "AMMONIA" in the last 168 hours. Coagulation Profile: Recent Labs  Lab 07/27/22 1658  INR 1.0   Cardiac Enzymes: No results for input(s): "CKTOTAL", "CKMB", "CKMBINDEX", "TROPONINI" in the last 168 hours. BNP (last 3 results) No results for input(s): "PROBNP" in the last 8760 hours. HbA1C: No results for input(s): "HGBA1C" in the last 72 hours. CBG: Recent Labs  Lab 07/27/22 1659  GLUCAP 101*   Lipid Profile: No results for input(s): "CHOL", "HDL", "LDLCALC", "TRIG", "CHOLHDL", "LDLDIRECT" in the last 72 hours. Thyroid Function Tests: No results for input(s): "TSH", "T4TOTAL", "FREET4", "T3FREE", "THYROIDAB" in the last 72 hours. Anemia Panel: No results for input(s): "VITAMINB12", "FOLATE", "FERRITIN", "TIBC", "IRON", "RETICCTPCT" in the last 72 hours. Urine analysis: No results found for: "COLORURINE", "APPEARANCEUR", "LABSPEC", "PHURINE", "GLUCOSEU", "HGBUR", "BILIRUBINUR", "KETONESUR", "PROTEINUR", "UROBILINOGEN", "NITRITE", "LEUKOCYTESUR"  Radiological Exams on Admission: DG Chest 2 View  Result Date: 07/27/2022 CLINICAL DATA:  Stroke evaluation EXAM: CHEST - 2 VIEW COMPARISON:  CXR 03/29/22 FINDINGS: No pleural effusion. No pneumothorax. No focal airspace opacity. Unchanged cardiac and mediastinal contours. No radiographically apparent displaced rib fractures. Visualized upper abdomen is unremarkable. Moderate hiatal hernia.  IMPRESSION: 1. No active cardiopulmonary disease. 2. Moderate hiatal hernia. Electronically Signed   By: Lorenza Cambridge M.D.   On: 07/27/2022 19:56   CT ANGIO HEAD NECK W WO CM (CODE STROKE)  Result Date: 07/27/2022 CLINICAL DATA:  Transient ischemic attack (TIA) EXAM: CT ANGIOGRAPHY HEAD AND NECK WITH AND WITHOUT CONTRAST TECHNIQUE: Multidetector CT imaging of the head and neck was performed using the standard protocol during bolus administration of intravenous contrast. Multiplanar CT image reconstructions and MIPs were obtained to evaluate the vascular anatomy. Carotid stenosis measurements (when applicable) are obtained utilizing NASCET criteria, using the distal internal carotid diameter as the denominator. RADIATION DOSE REDUCTION: This exam was performed according to the departmental dose-optimization program which includes automated exposure control, adjustment of the mA and/or kV according to patient size and/or use of iterative reconstruction technique. CONTRAST:  75mL OMNIPAQUE IOHEXOL 350 MG/ML SOLN COMPARISON:  Same day  CT head FINDINGS: CT HEAD FINDINGS See same day CT brain for intracranial findings. CTA NECK FINDINGS Aortic arch: Four-vessel arch. Imaged portion shows no evidence of aneurysm or dissection. No significant stenosis of the major arch vessel origins. Right carotid system: No evidence of dissection, stenosis (50% or greater), or occlusion. Left carotid system: No evidence of dissection, stenosis (50% or greater), or occlusion. Vertebral arteries: Codominant. The origin of the left vertebral artery is poorly assessed due to streak artifact. No evidence of dissection, stenosis (50% or greater), or occlusion. Skeleton: Negative. Other neck: Negative. Upper chest: Negative. Review of the MIP images confirms the above findings CTA HEAD FINDINGS Anterior circulation: There is severe focal stenosis in the distal M2 segment of the right (series 9, image 62). Moderate stenosis in a distal  M2/proximal M3 segment of the left MCA (series 100, image 64). Moderate to severe focal stenosis in a distal A2/proximal A3 segment of the right ACA (series 8, image 191). Posterior circulation:  Left P1 segment is small in caliber. Venous sinuses: As permitted by contrast timing, patent. Anatomic variants: None Review of the MIP images confirms the above findings IMPRESSION: 1. 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 2. Moderate stenosis in a distal M2/proximal M3 segment of the left MCA. 3. Moderate to severe focal stenosis in a distal A2/proximal A3 segment of the right ACA. 4. No hemodynamically significant stenosis in the neck. Electronically Signed   By: Lorenza Cambridge M.D.   On: 07/27/2022 18:15   CT HEAD CODE STROKE WO CONTRAST  Result Date: 07/27/2022 CLINICAL DATA:  Code stroke.  Neuro deficit, acute, stroke suspected EXAM: CT HEAD WITHOUT CONTRAST TECHNIQUE: Contiguous axial images were obtained from the base of the skull through the vertex without intravenous contrast. RADIATION DOSE REDUCTION: This exam was performed according to the departmental dose-optimization program which includes automated exposure control, adjustment of the mA and/or kV according to patient size and/or use of iterative reconstruction technique. COMPARISON:  None Available. FINDINGS: Brain: No evidence of acute large vascular territory infarction, hemorrhage, hydrocephalus, extra-axial collection or mass lesion/mass effect. Small age indeterminate lacunar infarcts in the left thalamus and left basal ganglia. Remote perforator infarct in the right basal ganglia. Patchy white matter hypodensities, nonspecific but compatible with chronic microvascular ischemic disease. Vascular: Calcific atherosclerosis.  No hyperdense vessel. Skull: No acute fracture. Sinuses/Orbits: No acute finding. Other: No mastoid effusions. ASPECTS [facility] Stroke Program Early CT Score) total score (0-10 with 10 being normal):  Probably 10. IMPRESSION: 1. Small age indeterminate lacunar infarcts in the left thalamus and left basal ganglia. MRI could better evaluate if clinically warranted. 2. No evidence of acute large vascular territory infarct or acute hemorrhage. Code stroke imaging results were communicated on 07/27/2022 at 4:56 pm to provider Bischof via telephone, who verbally acknowledged these results. Electronically Signed   By: Feliberto Harts M.D.   On: 07/27/2022 16:59    EKG: Independently reviewed.  Sinus rhythm, borderline T wave abnormalities in anterior leads.  No significant change since prior tracing.  Assessment and Plan  Acute onset neurologic symptoms concerning for acute stroke versus TIA Patient presenting with acute onset right hand and right foot paresthesias, LKW 1530. Seen by teleneurology and tPA not given as symptoms mild and resolved.  Not a candidate for mechanical thrombectomy as symptoms not consistent with LVO.  CT head showing small age-indeterminate lacunar infarcts in the left thalamus and left basal ganglia.  CTA head and neck showing  severe focal stenosis and a distal M2 segment of the right MCA, moderate stenosis and a distal M2/proximal M3 segment of the left MCA, and moderate to severe focal stenosis and a distal A2/proximal A3 segment of the right ACA.  Neurology recommended admission for stroke versus TIA workup. -Brain MRI done and results pending -Neurology recommending aspirin 81 mg daily and Plavix 75 mg daily for 3 weeks followed by aspirin alone. -Risk factor modification -Telemetry monitoring -Echocardiogram -Hemoglobin A1c, fasting lipid panel -Frequent neurochecks -PT, OT, speech therapy.  Hypertension Allow permissive hypertension at this time.  Hyperlipidemia Lipid panel ordered.  Pharmacy med rec pending.  Hypothyroidism Check TSH.  Pharmacy med rec pending.  DVT prophylaxis: Lovenox Code Status: DNR (discussed with the patient) Family Communication: No  family available at this time. Consults called: Neurology Level of care: Telemetry bed Admission status: It is my clinical opinion that referral for OBSERVATION is reasonable and necessary in this patient based on the above information provided. The aforementioned taken together are felt to place the patient at high risk for further clinical deterioration. However, it is anticipated that the patient may be medically stable for discharge from the hospital within 24 to 48 hours.   John Giovanni MD [facility]  If 7PM-7AM, please contact night-coverage [email or web address]  07/28/2022, 1:40 AM

## 2022-07-28 NOTE — Plan of Care (Signed)

## 2022-07-28 NOTE — Discharge Summary (Signed)
Physician Discharge Summary  Suzanne Kirby ZOX:[id number] DOB: June 09, 1945 DOA: 07/27/2022  PCP: Thana Ates, MD  Admit date: 07/27/2022 Discharge date: 07/28/2022 Admitted From: Home Disposition: Home Recommendations for Outpatient Follow-up:  Follow up with PCP in 1 week Check CMP and CBC at follow-up Consider increasing the Lipitor to 40 mg daily if she tolerates the 20 mg And allow some permissive hypertension given intracranial arterial stenosis Consider referral to cardiology about possible calcified aortic valve mass noted on echocardiogram. Please follow up on the following pending results: None  Home Health: Patient ambulates independently  Equipment/Devices: Not indicated  Discharge Condition: Stable CODE STATUS: DNR/DNI  Follow-up Information     Thana Ates, MD. Schedule an appointment as soon as possible for a visit in 1 week(s).   Specialty: Internal Medicine Contact information: [address] [place] [address] [phone]                 Hospital course 77 year old F with PMH of HTN, HLD, hypothyroidism and osteoarthritis presenting with acute right hand and right foot numbness and tingling that has improved on arrival to ED.  Neurology consulted.  tPA deferred due to significant improvement in his symptoms.  CT head without contrast raise concern for age-indeterminate left basal ganglia and thalamic infarct.  CT angio head and neck concerning for severe right distal M2/proximal M3 stenosis and moderate left distal M2/proximal M3 stenosis which does not explain patient's symptoms.  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  Patient was started on Plavix and aspirin in addition to home statin.  The next day, she remained asymptomatic and ambulating in the hallway independently.  No neurodeficit on exam.  TTE without significant finding other than possible calcified aortic valve mass.  LDL  92.  A1c 5.7%.  TSH normal.  Cleared for discharge by neurology on Plavix and aspirin for 3 weeks followed by aspirin alone.  Also increased her Lipitor to 20 mg daily.  Consider increasing Lipitor to 40 mg daily if she tolerates.  Consider allowing some permissive hypertension given intracranial stenosis.  Patient has been advised to avoid NSAID while she is on Plavix and aspirin.  In regards to echo finding of possible calcified aortic valve mass, recommend outpatient referral to cardiology for further evaluation.  See individual problem list below for more.   Problems addressed during this hospitalization Principal Problem:   Neurological symptoms Active Problems:   HTN (hypertension)   HLD (hyperlipidemia)   Hypothyroidism   Asymptomatic stenosis of intracranial artery   Aortic valve mass   TIA (transient ischemic attack)              Time spent 35 minutes  Vital signs Vitals:   07/28/22 0033 07/28/22 0434 07/28/22 0750 07/28/22 1126  BP: (!) 167/69 (!) 145/75 (!) 156/76 134/78  Pulse: 68 69 68 69  Temp: 97.7 F (36.5 C) 98.1 F (36.7 C) 98.3 F (36.8 C) 97.9 F (36.6 C)  Resp: 16 16 20 18   Height:      Weight:      SpO2: 99% 97% 100% 100%  TempSrc:  Oral Oral Tympanic  BMI (Calculated):         Discharge exam  GENERAL: No apparent distress.  Nontoxic. HEENT: MMM.  Vision grossly intact.  Diminished hearing (chronic). NECK: Supple.  No apparent JVD.  RESP:  No IWOB.  Fair aeration bilaterally. CVS:  RRR. Heart sounds normal.  ABD/GI/GU: BS+. Abd soft, NTND.  MSK/EXT:  Moves extremities. No apparent deformity. No edema.  SKIN: no apparent skin lesion or wound NEURO: Awake and alert. Oriented appropriately.  No apparent focal neuro deficit. PSYCH: Calm. Normal affect.   Discharge Instructions Discharge Instructions     Diet - low sodium heart healthy   Complete by: As directed    Discharge instructions   Complete by: As directed    It has been a  pleasure taking care of you!  You were hospitalized due to strokelike symptoms with right hand and right foot numbness.  Your MRI did not show acute or new stroke but you might have an old stroke that was unnoticed.  Your symptoms resolved.  We are discharging you on Plavix and aspirin for 3 weeks followed by aspirin alone.  We recommend not taking any over-the-counter pain medication other than plain Tylenol while taking Plavix and aspirin.  We have also increased your atorvastatin to reduce your risk of stroke.  There is small calcified mass on your aortic valve.  We do not believe this has anything to do with your symptoms of hospitalization but you may need to follow-up with cardiology outpatient.  Your primary care doctor can arrange outpatient follow-up for this.    Take care,   Increase activity slowly   Complete by: As directed       Allergies as of 07/28/2022       Reactions   Sulfa Antibiotics Rash, Other (See Comments)   Mouth sores         Medication List     STOP taking these medications    ibuprofen 200 MG tablet Commonly known as: ADVIL       TAKE these medications    amLODipine 5 MG tablet Commonly known as: NORVASC Take 5 mg by mouth daily.   aspirin EC 81 MG tablet Take 1 tablet (81 mg total) by mouth daily. Swallow whole. Start taking on: July 29, 2022   atorvastatin 20 MG tablet Commonly known as: LIPITOR Take 1 tablet (20 mg total) by mouth daily. What changed: Another medication with the same name was removed. Continue taking this medication, and follow the directions you see here.   CALCIUM PO Take 1 tablet by mouth in the morning and at bedtime.   cholecalciferol 25 MCG (1000 UNIT) tablet Commonly known as: VITAMIN D3 Take 1,000 Units by mouth in the morning and at bedtime.   clopidogrel 75 MG tablet Commonly known as: PLAVIX Take 1 tablet (75 mg total) by mouth daily for 21 days. Start taking on: July 29, 2022   famotidine 20 MG  tablet Commonly known as: PEPCID Take 20 mg by mouth daily.   fluocinonide 0.05 % external solution Commonly known as: LIDEX Apply topically 2 (two) times a week.   losartan 25 MG tablet Commonly known as: COZAAR Take 25 mg by mouth in the morning and at bedtime. What changed: Another medication with the same name was removed. Continue taking this medication, and follow the directions you see here.   Sodium Fluoride 5000 PPM 1.1 % Pste Generic drug: Sodium Fluoride Take 1 Application by mouth at bedtime.   Synthroid 75 MCG tablet Generic drug: levothyroxine Take 75 mcg by mouth every morning.   valACYclovir 500 MG tablet Commonly known as: VALTREX Take 500 mg by mouth 2 (two) times daily.        Consultations: Neurology  Procedures/Studies:   ECHOCARDIOGRAM COMPLETE  Result Date: 07/28/2022  ECHOCARDIOGRAM REPORT   Patient Name:   ANNEKA STUDER Date of Exam: 07/28/2022 Medical Rec #:  [id number]         Height:       61.0 in Accession #:    [id number]        Weight:       172.6 lb Date of Birth:  06-08-1945         BSA:          1.774 m Patient Age:    77 years          BP:           156/76 mmHg Patient Gender: F                 HR:           69 bpm. Exam Location:  Inpatient Procedure: 2D Echo, Color Doppler and Cardiac Doppler Indications:     stroke  History:         Patient has no prior history of Echocardiogram examinations.                  Risk Factors:Hypertension and Dyslipidemia.  Sonographer:     Delcie Roch RDCS Referring Phys:  [id number] VASUNDHRA RATHORE Diagnosing Phys: [place] IMPRESSIONS  1. Left ventricular ejection fraction, by estimation, is 60 to 65%. The left ventricle has normal function. The left ventricle has no regional wall motion abnormalities. There is mild left ventricular hypertrophy. Left ventricular diastolic parameters are consistent with Grade I diastolic dysfunction (impaired relaxation).  2. Right ventricular systolic function is  normal. The right ventricular size is normal. Tricuspid regurgitation signal is inadequate for assessing PA pressure.  3. Left atrial size was mildly dilated.  4. No evidence of mitral valve regurgitation.  5. The aortic valve was not well visualized. Aortic valve regurgitation is not visualized.  6. The inferior vena cava is normal in size with greater than 50% respiratory variability, suggesting right atrial pressure of 3 mmHg. Conclusion(s)/Recommendation(s): On the short axis view (slide 20) ; there is a mass along the aortic valve, near the [facility]. Appears calcified. Consider TEE if embolic source is speculated. FINDINGS  Left Ventricle: Left ventricular ejection fraction, by estimation, is 60 to 65%. The left ventricle has normal function. The left ventricle has no regional wall motion abnormalities. The left ventricular internal cavity size was normal in size. There is  mild left ventricular hypertrophy. Left ventricular diastolic parameters are consistent with Grade I diastolic dysfunction (impaired relaxation). Right Ventricle: The right ventricular size is normal. Right ventricular systolic function is normal. Tricuspid regurgitation signal is inadequate for assessing PA pressure. Left Atrium: Left atrial size was mildly dilated. Right Atrium: Right atrial size was normal in size. Pericardium: There is no evidence of pericardial effusion. Mitral Valve: There is moderate calcification of the mitral valve leaflet(s). No evidence of mitral valve regurgitation. Tricuspid Valve: Tricuspid valve regurgitation is not demonstrated. Aortic Valve: The aortic valve was not well visualized. Aortic valve regurgitation is not visualized. Aortic valve mean gradient measures 11.0 mmHg. Aortic valve peak gradient measures 19.9 mmHg. Aortic valve area, by VTI measures 2.52 cm. Pulmonic Valve: Pulmonic valve regurgitation is not visualized. Aorta: The aortic root and ascending aorta are structurally normal, with no evidence of  dilitation. Venous: The inferior vena cava is normal in size with greater than 50% respiratory variability, suggesting right atrial pressure of 3 mmHg. IAS/Shunts: The interatrial septum was not well visualized.  LEFT VENTRICLE  PLAX 2D LVIDd:         3.90 cm   Diastology LVIDs:         2.20 cm   LV e' medial:    4.47 cm/s LV PW:         0.90 cm   LV E/e' medial:  19.9 LV IVS:        1.00 cm   LV e' lateral:   5.50 cm/s LVOT diam:     2.30 cm   LV E/e' lateral: 16.2 LV SV:         114 LV SV Index:   64 LVOT Area:     4.15 cm  RIGHT VENTRICLE             IVC RV S prime:     20.00 cm/s  IVC diam: 1.20 cm TAPSE (M-mode): 2.0 cm LEFT ATRIUM             Index        RIGHT ATRIUM           Index LA diam:        4.50 cm 2.54 cm/m   RA Area:     13.10 cm LA Vol (A2C):   75.3 ml 42.44 ml/m  RA Volume:   26.60 ml  14.99 ml/m LA Vol (A4C):   55.6 ml 31.34 ml/m LA Biplane Vol: 65.3 ml 36.81 ml/m  AORTIC VALVE AV Area (Vmax):    2.43 cm AV Area (Vmean):   2.44 cm AV Area (VTI):     2.52 cm AV Vmax:           223.00 cm/s AV Vmean:          150.000 cm/s AV VTI:            0.452 m AV Peak Grad:      19.9 mmHg AV Mean Grad:      11.0 mmHg LVOT Vmax:         130.33 cm/s LVOT Vmean:        88.267 cm/s LVOT VTI:          0.274 m LVOT/AV VTI ratio: 0.61  AORTA Ao Root diam: 2.80 cm Ao Asc diam:  3.70 cm MITRAL VALVE MV Area (PHT): 2.82 cm     SHUNTS MV Decel Time: 269 msec     Systemic VTI:  0.27 m MV E velocity: 89.10 cm/s   Systemic Diam: 2.30 cm MV A velocity: 114.00 cm/s MV E/A ratio:  0.78 [place] signed by Carolan Clines Signature Date/Time: 07/28/2022/11:34:32 AM    Final (Updated)    MR BRAIN WO CONTRAST  Result Date: 07/28/2022 CLINICAL DATA:  Initial evaluation for neuro deficit, stroke. EXAM: MRI HEAD WITHOUT CONTRAST TECHNIQUE: Multiplanar, multiecho pulse sequences of the brain and surrounding structures were obtained without intravenous contrast. COMPARISON:  CT from 07/27/2022. FINDINGS:  Brain: Cerebral volume within normal limits. Patchy T2/FLAIR hyperintensity involving the periventricular and deep white matter both cerebral hemispheres, consistent with chronic small vessel ischemic disease, moderately advanced in nature. Few scatter remote lacunar infarcts present about the bilateral basal ganglia and left thalamus. No evidence for acute or subacute ischemia. Gray-white matter differentiation maintained. No areas of chronic cortical infarction. No acute intracranial hemorrhage. Single punctate chronic microhemorrhage noted within the peripheral right cerebellum, of doubtful significance in isolation. No mass lesion, midline shift or mass effect. No hydrocephalus or extra-axial fluid collection. Pituitary gland and suprasellar region within normal limits. Vascular: Major intracranial  vascular flow voids are maintained. Skull and upper cervical spine: Craniocervical junction normal. Bone marrow signal intensity normal. No scalp soft tissue abnormality. Sinuses/Orbits: Prior bilateral ocular lens replacement. Paranasal sinuses are largely clear. No significant mastoid effusion. Other: None. IMPRESSION: 1. No acute intracranial abnormality. 2. Moderately advanced chronic microvascular ischemic disease with a few remote lacunar infarcts about the bilateral basal ganglia and left thalamus. Electronically Signed   By: Rise Mu M.D.   On: 07/28/2022 05:36   DG Chest 2 View  Result Date: 07/27/2022 CLINICAL DATA:  Stroke evaluation EXAM: CHEST - 2 VIEW COMPARISON:  CXR 03/29/22 FINDINGS: No pleural effusion. No pneumothorax. No focal airspace opacity. Unchanged cardiac and mediastinal contours. No radiographically apparent displaced rib fractures. Visualized upper abdomen is unremarkable. Moderate hiatal hernia. IMPRESSION: 1. No active cardiopulmonary disease. 2. Moderate hiatal hernia. Electronically Signed   By: Lorenza Cambridge M.D.   On: 07/27/2022 19:56   CT ANGIO HEAD NECK W WO CM  (CODE STROKE)  Result Date: 07/27/2022 CLINICAL DATA:  Transient ischemic attack (TIA) EXAM: CT ANGIOGRAPHY HEAD AND NECK WITH AND WITHOUT CONTRAST TECHNIQUE: Multidetector CT imaging of the head and neck was performed using the standard protocol during bolus administration of intravenous contrast. Multiplanar CT image reconstructions and MIPs were obtained to evaluate the vascular anatomy. Carotid stenosis measurements (when applicable) are obtained utilizing NASCET criteria, using the distal internal carotid diameter as the denominator. RADIATION DOSE REDUCTION: This exam was performed according to the departmental dose-optimization program which includes automated exposure control, adjustment of the mA and/or kV according to patient size and/or use of iterative reconstruction technique. CONTRAST:  75mL OMNIPAQUE IOHEXOL 350 MG/ML SOLN COMPARISON:  Same day CT head FINDINGS: CT HEAD FINDINGS See same day CT brain for intracranial findings. CTA NECK FINDINGS Aortic arch: Four-vessel arch. Imaged portion shows no evidence of aneurysm or dissection. No significant stenosis of the major arch vessel origins. Right carotid system: No evidence of dissection, stenosis (50% or greater), or occlusion. Left carotid system: No evidence of dissection, stenosis (50% or greater), or occlusion. Vertebral arteries: Codominant. The origin of the left vertebral artery is poorly assessed due to streak artifact. No evidence of dissection, stenosis (50% or greater), or occlusion. Skeleton: Negative. Other neck: Negative. Upper chest: Negative. Review of the MIP images confirms the above findings CTA HEAD FINDINGS Anterior circulation: There is severe focal stenosis in the distal M2 segment of the right (series 9, image 62). Moderate stenosis in a distal M2/proximal M3 segment of the left MCA (series 100, image 64). Moderate to severe focal stenosis in a distal A2/proximal A3 segment of the right ACA (series 8, image 191). Posterior  circulation:  Left P1 segment is small in caliber. Venous sinuses: As permitted by contrast timing, patent. Anatomic variants: None Review of the MIP images confirms the above findings IMPRESSION: 1. 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 2. Moderate stenosis in a distal M2/proximal M3 segment of the left MCA. 3. Moderate to severe focal stenosis in a distal A2/proximal A3 segment of the right ACA. 4. No hemodynamically significant stenosis in the neck. Electronically Signed   By: Lorenza Cambridge M.D.   On: 07/27/2022 18:15   CT HEAD CODE STROKE WO CONTRAST  Result Date: 07/27/2022 CLINICAL DATA:  Code stroke.  Neuro deficit, acute, stroke suspected EXAM: CT HEAD WITHOUT CONTRAST TECHNIQUE: Contiguous axial images were obtained from the base of the skull through the vertex without intravenous contrast. RADIATION DOSE  REDUCTION: This exam was performed according to the departmental dose-optimization program which includes automated exposure control, adjustment of the mA and/or kV according to patient size and/or use of iterative reconstruction technique. COMPARISON:  None Available. FINDINGS: Brain: No evidence of acute large vascular territory infarction, hemorrhage, hydrocephalus, extra-axial collection or mass lesion/mass effect. Small age indeterminate lacunar infarcts in the left thalamus and left basal ganglia. Remote perforator infarct in the right basal ganglia. Patchy white matter hypodensities, nonspecific but compatible with chronic microvascular ischemic disease. Vascular: Calcific atherosclerosis.  No hyperdense vessel. Skull: No acute fracture. Sinuses/Orbits: No acute finding. Other: No mastoid effusions. ASPECTS [facility] Stroke Program Early CT Score) total score (0-10 with 10 being normal): Probably 10. IMPRESSION: 1. Small age indeterminate lacunar infarcts in the left thalamus and left basal ganglia. MRI could better evaluate if clinically warranted. 2. No evidence of  acute large vascular territory infarct or acute hemorrhage. Code stroke imaging results were communicated on 07/27/2022 at 4:56 pm to provider Dur[place]inger via telephone, who verbally acknowledged these results. Electronically Signed   By: Feliberto Harts M.D.   On: 07/27/2022 16:59       The results of significant diagnostics from this hospitalization (including imaging, microbiology, ancillary and laboratory) are listed below for reference.     Microbiology: No results found for this or any previous visit (from the past 240 hour(s)).   Labs:  CBC: Recent Labs  Lab 07/27/22 1658  WBC 8.1  NEUTROABS 3.9  HGB 11.7*  HCT 37.9  MCV 93.1  PLT 308   BMP &GFR Recent Labs  Lab 07/27/22 1658  NA 139  K 4.2  CL 106  CO2 26  GLUCOSE 109*  BUN 21  CREATININE 1.01*  CALCIUM 9.5   Estimated Creatinine Clearance: 43.5 mL/min (A) (by C-G formula based on SCr of 1.01 mg/dL (H)). Liver & Pancreas: Recent Labs  Lab 07/27/22 1658  AST 23  ALT 14  ALKPHOS 109  BILITOT 0.6  PROT 7.6  ALBUMIN 3.9   No results for input(s): "LIPASE", "AMYLASE" in the last 168 hours. No results for input(s): "AMMONIA" in the last 168 hours. Diabetic: Recent Labs    07/28/22 0642  HGBA1C 5.7*   Recent Labs  Lab 07/27/22 1659  GLUCAP 101*   Cardiac Enzymes: No results for input(s): "CKTOTAL", "CKMB", "CKMBINDEX", "TROPONINI" in the last 168 hours. No results for input(s): "PROBNP" in the last 8760 hours. Coagulation Profile: Recent Labs  Lab 07/27/22 1658  INR 1.0   Thyroid Function Tests: Recent Labs    07/28/22 0642  TSH 1.772   Lipid Profile: Recent Labs    07/28/22 0642  CHOL 139  HDL 26*  LDLCALC 92  TRIG 161  CHOLHDL 5.3   Anemia Panel: No results for input(s): "VITAMINB12", "FOLATE", "FERRITIN", "TIBC", "IRON", "RETICCTPCT" in the last 72 hours. Urine analysis: No results found for: "COLORURINE", "APPEARANCEUR", "LABSPEC", "PHURINE", "GLUCOSEU", "HGBUR",  "BILIRUBINUR", "KETONESUR", "PROTEINUR", "UROBILINOGEN", "NITRITE", "LEUKOCYTESUR" Sepsis Labs: Invalid input(s): "PROCALCITONIN", "LACTICIDVEN"   SIGNED:  Almon Hercules, MD  [facility] 07/28/2022, 5:03 PM

## 2022-07-28 NOTE — Progress Notes (Signed)
OT Screen  Patient Details Name: Suzanne Kirby MRN: [id number] DOB: [birth year]   Cancelled Treatment:    Reason Eval/Treat Not Completed: OT screened, no needs identified, will sign off. Pt at or near baseline per PT and communicate with PT Anessa regarding acute needs. Ot to sign off   Mateo Flow 07/28/2022, 8:27 AM

## 2022-07-28 NOTE — Progress Notes (Signed)
  Echocardiogram 2D Echocardiogram has been performed.  Delcie Roch 07/28/2022, 11:21 AM

## 2022-07-28 NOTE — Progress Notes (Signed)
Patient is still asking for her synthroid. Notified Dr. Loney Loh again and awaiting for response.

## 2022-07-28 NOTE — Progress Notes (Addendum)
STROKE TEAM PROGRESS NOTE   INTERVAL HISTORY No family at bedside.  Patient was transferred to [facility] after being seen telemetry medicine consult.  She complained of right hand and foot tingling and numbness, denied headache, vision changes, gait or balance or speech difficulties.  No tPA was given due to due to mild, resolving symptoms.  No thrombectomy as symptoms did not seem like LVO. MRI negative. On exam today, NIH of 0.  No focal weakness, no aphasia, no dysarthria.  Patient denies headache, vision changes, balance/gait issues. Patient was on no antithrombotic at home.  Recommend aspirin and Plavix for 3 weeks, then aspirin alone combined with close monitoring of multiple risk factors.  Vitals:   07/28/22 0033 07/28/22 0434 07/28/22 0750 07/28/22 1126  BP: (!) 167/69 (!) 145/75 (!) 156/76 134/78  Pulse: 68 69 68 69  Resp: 16 16 20 18   Temp: 97.7 F (36.5 C) 98.1 F (36.7 C) 98.3 F (36.8 C) 97.9 F (36.6 C)  TempSrc:  Oral Oral Tympanic  SpO2: 99% 97% 100% 100%  Weight:      Height:       CBC:  Recent Labs  Lab 07/27/22 1658  WBC 8.1  NEUTROABS 3.9  HGB 11.7*  HCT 37.9  MCV 93.1  PLT 308   Basic Metabolic Panel:  Recent Labs  Lab 07/27/22 1658  NA 139  K 4.2  CL 106  CO2 26  GLUCOSE 109*  BUN 21  CREATININE 1.01*  CALCIUM 9.5   Lipid Panel:  Recent Labs  Lab 07/28/22 0642  CHOL 139  TRIG 107  HDL 26*  CHOLHDL 5.3  VLDL 21  LDLCALC 92   HgbA1c:  Recent Labs  Lab 07/28/22 0642  HGBA1C 5.7*   Urine Drug Screen: No results for input(s): "LABOPIA", "COCAINSCRNUR", "LABBENZ", "AMPHETMU", "THCU", "LABBARB" in the last 168 hours.  Alcohol Level  Recent Labs  Lab 07/27/22 1658  ETH <10    IMAGING past 24 hours ECHOCARDIOGRAM COMPLETE  Result Date: 07/28/2022    ECHOCARDIOGRAM REPORT   Patient Name:   DOXIE AUGENSTEIN Date of Exam: 07/28/2022 Medical Rec #:  [id number]         Height:       61.0 in Accession #:    [id number]        Weight:        172.6 lb Date of Birth:  [birth year]         BSA:          1.774 m Patient Age:    77 years          BP:           156/76 mmHg Patient Gender: F                 HR:           69 bpm. Exam Location:  Inpatient Procedure: 2D Echo, Color Doppler and Cardiac Doppler Indications:     stroke  History:         Patient has no prior history of Echocardiogram examinations.                  Risk Factors:Hypertension and Dyslipidemia.  Sonographer:     Delcie Roch RDCS Referring Phys:  [id number] VASUNDHRA RATHORE Diagnosing Phys: [place] IMPRESSIONS  1. Left ventricular ejection fraction, by estimation, is 60 to 65%. The left ventricle has normal function. The left ventricle has no regional wall motion abnormalities. There is mild  left ventricular hypertrophy. Left ventricular diastolic parameters are consistent with Grade I diastolic dysfunction (impaired relaxation).  2. Right ventricular systolic function is normal. The right ventricular size is normal. Tricuspid regurgitation signal is inadequate for assessing PA pressure.  3. Left atrial size was mildly dilated.  4. No evidence of mitral valve regurgitation.  5. The aortic valve was not well visualized. Aortic valve regurgitation is not visualized.  6. The inferior vena cava is normal in size with greater than 50% respiratory variability, suggesting right atrial pressure of 3 mmHg. Conclusion(s)/Recommendation(s): On the short axis view (slide 20) ; there is a mass along the aortic valve, near the [facility]. Appears calcified. Consider TEE if embolic source is speculated. FINDINGS  Left Ventricle: Left ventricular ejection fraction, by estimation, is 60 to 65%. The left ventricle has normal function. The left ventricle has no regional wall motion abnormalities. The left ventricular internal cavity size was normal in size. There is  mild left ventricular hypertrophy. Left ventricular diastolic parameters are consistent with Grade I diastolic dysfunction (impaired  relaxation). Right Ventricle: The right ventricular size is normal. Right ventricular systolic function is normal. Tricuspid regurgitation signal is inadequate for assessing PA pressure. Left Atrium: Left atrial size was mildly dilated. Right Atrium: Right atrial size was normal in size. Pericardium: There is no evidence of pericardial effusion. Mitral Valve: There is moderate calcification of the mitral valve leaflet(s). No evidence of mitral valve regurgitation. Tricuspid Valve: Tricuspid valve regurgitation is not demonstrated. Aortic Valve: The aortic valve was not well visualized. Aortic valve regurgitation is not visualized. Aortic valve mean gradient measures 11.0 mmHg. Aortic valve peak gradient measures 19.9 mmHg. Aortic valve area, by VTI measures 2.52 cm. Pulmonic Valve: Pulmonic valve regurgitation is not visualized. Aorta: The aortic root and ascending aorta are structurally normal, with no evidence of dilitation. Venous: The inferior vena cava is normal in size with greater than 50% respiratory variability, suggesting right atrial pressure of 3 mmHg. IAS/Shunts: The interatrial septum was not well visualized.  LEFT VENTRICLE PLAX 2D LVIDd:         3.90 cm   Diastology LVIDs:         2.20 cm   LV e' medial:    4.47 cm/s LV PW:         0.90 cm   LV E/e' medial:  19.9 LV IVS:        1.00 cm   LV e' lateral:   5.50 cm/s LVOT diam:     2.30 cm   LV E/e' lateral: 16.2 LV SV:         114 LV SV Index:   64 LVOT Area:     4.15 cm  RIGHT VENTRICLE             IVC RV S prime:     20.00 cm/s  IVC diam: 1.20 cm TAPSE (M-mode): 2.0 cm LEFT ATRIUM             Index        RIGHT ATRIUM           Index LA diam:        4.50 cm 2.54 cm/m   RA Area:     13.10 cm LA Vol (A2C):   75.3 ml 42.44 ml/m  RA Volume:   26.60 ml  14.99 ml/m LA Vol (A4C):   55.6 ml 31.34 ml/m LA Biplane Vol: 65.3 ml 36.81 ml/m  AORTIC VALVE AV Area (Vmax):    2.43  cm AV Area (Vmean):   2.44 cm AV Area (VTI):     2.52 cm AV Vmax:            223.00 cm/s AV Vmean:          150.000 cm/s AV VTI:            0.452 m AV Peak Grad:      19.9 mmHg AV Mean Grad:      11.0 mmHg LVOT Vmax:         130.33 cm/s LVOT Vmean:        88.267 cm/s LVOT VTI:          0.274 m LVOT/AV VTI ratio: 0.61  AORTA Ao Root diam: 2.80 cm Ao Asc diam:  3.70 cm MITRAL VALVE MV Area (PHT): 2.82 cm     SHUNTS MV Decel Time: 269 msec     Systemic VTI:  0.27 m MV E velocity: 89.10 cm/s   Systemic Diam: 2.30 cm MV A velocity: 114.00 cm/s MV E/A ratio:  0.78 [place] signed by Carolan Clines Signature Date/Time: 07/28/2022/11:34:32 AM    Final (Updated)    MR BRAIN WO CONTRAST  Result Date: 07/28/2022 CLINICAL DATA:  Initial evaluation for neuro deficit, stroke. EXAM: MRI HEAD WITHOUT CONTRAST TECHNIQUE: Multiplanar, multiecho pulse sequences of the brain and surrounding structures were obtained without intravenous contrast. COMPARISON:  CT from 07/27/2022. FINDINGS: Brain: Cerebral volume within normal limits. Patchy T2/FLAIR hyperintensity involving the periventricular and deep white matter both cerebral hemispheres, consistent with chronic small vessel ischemic disease, moderately advanced in nature. Few scatter remote lacunar infarcts present about the bilateral basal ganglia and left thalamus. No evidence for acute or subacute ischemia. Gray-white matter differentiation maintained. No areas of chronic cortical infarction. No acute intracranial hemorrhage. Single punctate chronic microhemorrhage noted within the peripheral right cerebellum, of doubtful significance in isolation. No mass lesion, midline shift or mass effect. No hydrocephalus or extra-axial fluid collection. Pituitary gland and suprasellar region within normal limits. Vascular: Major intracranial vascular flow voids are maintained. Skull and upper cervical spine: Craniocervical junction normal. Bone marrow signal intensity normal. No scalp soft tissue abnormality. Sinuses/Orbits: Prior bilateral ocular  lens replacement. Paranasal sinuses are largely clear. No significant mastoid effusion. Other: None. IMPRESSION: 1. No acute intracranial abnormality. 2. Moderately advanced chronic microvascular ischemic disease with a few remote lacunar infarcts about the bilateral basal ganglia and left thalamus. Electronically Signed   By: Rise Mu M.D.   On: 07/28/2022 05:36   DG Chest 2 View  Result Date: 07/27/2022 CLINICAL DATA:  Stroke evaluation EXAM: CHEST - 2 VIEW COMPARISON:  CXR 03/29/22 FINDINGS: No pleural effusion. No pneumothorax. No focal airspace opacity. Unchanged cardiac and mediastinal contours. No radiographically apparent displaced rib fractures. Visualized upper abdomen is unremarkable. Moderate hiatal hernia. IMPRESSION: 1. No active cardiopulmonary disease. 2. Moderate hiatal hernia. Electronically Signed   By: Lorenza Cambridge M.D.   On: 07/27/2022 19:56   CT ANGIO HEAD NECK W WO CM (CODE STROKE)  Result Date: 07/27/2022 CLINICAL DATA:  Transient ischemic attack (TIA) EXAM: CT ANGIOGRAPHY HEAD AND NECK WITH AND WITHOUT CONTRAST TECHNIQUE: Multidetector CT imaging of the head and neck was performed using the standard protocol during bolus administration of intravenous contrast. Multiplanar CT image reconstructions and MIPs were obtained to evaluate the vascular anatomy. Carotid stenosis measurements (when applicable) are obtained utilizing NASCET criteria, using the distal internal carotid diameter as the denominator. RADIATION DOSE REDUCTION: This exam was performed according  to the departmental dose-optimization program which includes automated exposure control, adjustment of the mA and/or kV according to patient size and/or use of iterative reconstruction technique. CONTRAST:  75mL OMNIPAQUE IOHEXOL 350 MG/ML SOLN COMPARISON:  Same day CT head FINDINGS: CT HEAD FINDINGS See same day CT brain for intracranial findings. CTA NECK FINDINGS Aortic arch: Four-vessel arch. Imaged portion  shows no evidence of aneurysm or dissection. No significant stenosis of the major arch vessel origins. Right carotid system: No evidence of dissection, stenosis (50% or greater), or occlusion. Left carotid system: No evidence of dissection, stenosis (50% or greater), or occlusion. Vertebral arteries: Codominant. The origin of the left vertebral artery is poorly assessed due to streak artifact. No evidence of dissection, stenosis (50% or greater), or occlusion. Skeleton: Negative. Other neck: Negative. Upper chest: Negative. Review of the MIP images confirms the above findings CTA HEAD FINDINGS Anterior circulation: There is severe focal stenosis in the distal M2 segment of the right (series 9, image 62). Moderate stenosis in a distal M2/proximal M3 segment of the left MCA (series 100, image 64). Moderate to severe focal stenosis in a distal A2/proximal A3 segment of the right ACA (series 8, image 191). Posterior circulation:  Left P1 segment is small in caliber. Venous sinuses: As permitted by contrast timing, patent. Anatomic variants: None Review of the MIP images confirms the above findings IMPRESSION: 1. 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 2. Moderate stenosis in a distal M2/proximal M3 segment of the left MCA. 3. Moderate to severe focal stenosis in a distal A2/proximal A3 segment of the right ACA. 4. No hemodynamically significant stenosis in the neck. Electronically Signed   By: Lorenza Cambridge M.D.   On: 07/27/2022 18:15   CT HEAD CODE STROKE WO CONTRAST  Result Date: 07/27/2022 CLINICAL DATA:  Code stroke.  Neuro deficit, acute, stroke suspected EXAM: CT HEAD WITHOUT CONTRAST TECHNIQUE: Contiguous axial images were obtained from the base of the skull through the vertex without intravenous contrast. RADIATION DOSE REDUCTION: This exam was performed according to the departmental dose-optimization program which includes automated exposure control, adjustment of the mA and/or kV  according to patient size and/or use of iterative reconstruction technique. COMPARISON:  None Available. FINDINGS: Brain: No evidence of acute large vascular territory infarction, hemorrhage, hydrocephalus, extra-axial collection or mass lesion/mass effect. Small age indeterminate lacunar infarcts in the left thalamus and left basal ganglia. Remote perforator infarct in the right basal ganglia. Patchy white matter hypodensities, nonspecific but compatible with chronic microvascular ischemic disease. Vascular: Calcific atherosclerosis.  No hyperdense vessel. Skull: No acute fracture. Sinuses/Orbits: No acute finding. Other: No mastoid effusions. ASPECTS [facility] Stroke Program Early CT Score) total score (0-10 with 10 being normal): Probably 10. IMPRESSION: 1. Small age indeterminate lacunar infarcts in the left thalamus and left basal ganglia. MRI could better evaluate if clinically warranted. 2. No evidence of acute large vascular territory infarct or acute hemorrhage. Code stroke imaging results were communicated on 07/27/2022 at 4:56 pm to provider Hotz via telephone, who verbally acknowledged these results. Electronically Signed   By: Feliberto Harts M.D.   On: 07/27/2022 16:59    PHYSICAL EXAM  Temp:  [97.7 F (36.5 C)-98.4 F (36.9 C)] 97.9 F (36.6 C) (04/26 1126) Pulse Rate:  [62-94] 69 (04/26 1126) Resp:  [12-21] 18 (04/26 1126) BP: (134-167)/(58-81) 134/78 (04/26 1126) SpO2:  [95 %-100 %] 100 % (04/26 1126) Weight:  [78.3 kg] 78.3 kg (04/26 0029)  General - Well  nourished, well developed, elderly Caucasian female in no apparent distress. Cardiovascular - Regular rhythm and rate.  Mental Status -  Level of arousal and orientation to time, place, and person were intact. Language including expression, naming, repetition, comprehension was assessed and found intact. Attention span and concentration, Recent and remote memory were intact. Fund of Knowledge was assessed and was  intact.  Cranial Nerves II - XII - II - Visual field intact OU. III, IV, VI - Extraocular movements intact. V - Facial sensation intact bilaterally. VII - Facial movement intact bilaterally. VIII - Hearing & vestibular intact bilaterally. X - Palate elevates symmetrically. XI - Chin turning & shoulder shrug intact bilaterally. XII - Tongue protrusion intact.  Motor Strength - The patient's strength was normal in all extremities and pronator drift was absent.  Bulk was normal and fasciculations were absent.   Motor Tone - Muscle tone was assessed at the neck and appendages and was normal. Sensory - Light touch assessed and symmetrical.   Coordination - The patient had normal movements in the hands and feet with no ataxia or dysmetria.  Tremor was absent.  Gait and Station - deferred.   ASSESSMENT/PLAN Ms. Vader is a 77 year old pleasant Caucasian lady with past medical history of hypertension, hyperlipidemia, hypothyroidism and arthritis who developed sudden onset of right hand and foot tingling and numbness at 1530 4/25. She denied any accompanying symptoms in the form of headache, blurred vision, double vision, gait or balance or speech difficulties.  She denies any known prior history of strokes or TIAs or significant neurological problems.  Imaging negative for stroke.   Left brain TIA  Etiology: Small vessel disease secondary to multiple risk factors Code Stroke CT head: Small lacunar infarcts in the left thalamus and left breast basal ganglia. No evidence of acute infarct or acute hemorrhage CTA head & neck  Severe focal stenosis distal M2 segment of the right MCA Moderate stenosis distal M2/proximal M3 of left MCA Moderate to severe stenosis distal A2/proximal A3 segment of the right ACA MRI: No acute intracranial abnormality. Moderately advanced chronic microvascular ischemic disease with a few remote lacunar infarcts about the bilateral basal ganglia and left thalamus 2D  Echo: EF 60 to 65%, mild LVH, grade 1 diastolic dysfunction, mildly dilated left atrium.   LDL 92 HgbA1c 5.7 VTE prophylaxis - lovenox    Diet   Diet Heart Room service appropriate? Yes; Fluid consistency: Thin   No antithrombotic prior to admission, now on aspirin 81 mg daily and clopidogrel 75 mg daily.  Recommend aspirin and Plavix for 3 weeks then aspirin alone. Therapy recommendations: No follow-up Disposition: Pending  Hypertension Home meds: Norvasc 5 mg, losartan 50 mg Permissive hypertension (OK if < 180/105) but gradually normalize in 1-2 days Long-term BP goal normotensive  Hyperlipidemia Home meds: Lipitor 20 mg, resumed in hospital LDL 92, goal < 70 Increase to Lipitor 40 mg Continue statin at discharge  Diabetes type II, no history Home meds:  none HgbA1c 5.7, goal < 7.0 CBGs Recent Labs    07/27/22 1659  GLUCAP 101*    SSI Recommend close PCP follow-up  Other Stroke Risk Factors Advanced Age >/= 19  Obesity, Body mass index is 32.62 kg/m., BMI >/= 30 associated with increased stroke risk, recommend weight loss, diet and exercise as appropriate   Hospital day # 0  Patient is OK for discharge from neurology standpoint, with recommendations as above. Follow-up with outpatient neurology in 6-8 weeks.   Pt seen by Neuro  NP/APP and later by MD. Note/plan to be edited by MD as needed.    Lynnae January, DNP, AGACNP-BC [facility] Please use AMION for contact information & EPIC for messaging.  STROKE MD NOTE :  I have personally obtained history,examined this patient, reviewed notes, independently viewed imaging studies, participated in medical decision making and plan of care.ROS completed by me personally and pertinent positives fully documented  I have made any additions or clarifications directly to the above note. Agree with note above.  Patient presented with right hand and feet paresthesias which lasted about 2 to 3 hours and have recovered  completely.  MRI is negative for stroke but CT angiogram shows bilateral MCA and ACA intracranial stenosis.  Recommend aspirin Plavix for 3 weeks followed by aspirin and aggressive risk factor modification.  Long discussion with patient and answered questions.  Discussed with Dr. Alanda Slim.  Greater than 50% time during this 50-minute visit was spent on counseling and coordination of care about her TIA and discussion about stroke evaluation, prevention and treatment and answering questions.  Delia Heady, MD Medical Director [facility] Pager: [phone] 07/28/2022 1:46 PM   To contact Stroke Continuity provider, please refer to [email or web address]. After hours, contact General Neurology

## 2022-07-28 NOTE — Progress Notes (Signed)
SLP Cancellation Note  Patient Details Name: Brixton Schnapp MRN: [id number] DOB: Feb 03, 1946   Cancelled treatment:       Reason Eval/Treat Not Completed: SLP screened, no needs identified in regards to speech/language/cognition, pt at baseline, will sign off.     Avie Echevaria, MA, CCC-SLP Acute Rehabilitation Services Office Number: [phone]  Paulette Blanch 07/28/2022, 8:49 AM

## 2022-07-28 NOTE — Progress Notes (Signed)
Patient requesting to get her Synthroid this morning. Notified Dr. Loney Loh and she stated, "Pharmacy med rec is pending." Will notify patient when she wakes up.

## 2022-07-28 NOTE — Progress Notes (Signed)
Patient medications sent to the Pharmacy.

## 2022-08-02 DIAGNOSIS — F4321 Adjustment disorder with depressed mood: Secondary | ICD-10-CM | POA: Diagnosis not present

## 2022-08-03 DIAGNOSIS — Z8673 Personal history of transient ischemic attack (TIA), and cerebral infarction without residual deficits: Secondary | ICD-10-CM | POA: Diagnosis not present

## 2022-08-03 DIAGNOSIS — R7303 Prediabetes: Secondary | ICD-10-CM | POA: Diagnosis not present

## 2022-08-03 DIAGNOSIS — D649 Anemia, unspecified: Secondary | ICD-10-CM | POA: Diagnosis not present

## 2022-08-03 DIAGNOSIS — Z0000 Encounter for general adult medical examination without abnormal findings: Secondary | ICD-10-CM | POA: Diagnosis not present

## 2022-08-03 DIAGNOSIS — I10 Essential (primary) hypertension: Secondary | ICD-10-CM | POA: Diagnosis not present

## 2022-08-03 DIAGNOSIS — E7800 Pure hypercholesterolemia, unspecified: Secondary | ICD-10-CM | POA: Diagnosis not present

## 2022-08-03 DIAGNOSIS — Z79899 Other long term (current) drug therapy: Secondary | ICD-10-CM | POA: Diagnosis not present

## 2022-08-03 DIAGNOSIS — I358 Other nonrheumatic aortic valve disorders: Secondary | ICD-10-CM | POA: Diagnosis not present

## 2022-08-10 DIAGNOSIS — R400 Somnolence: Secondary | ICD-10-CM | POA: Diagnosis not present

## 2022-08-10 DIAGNOSIS — G4733 Obstructive sleep apnea (adult) (pediatric): Secondary | ICD-10-CM | POA: Diagnosis not present

## 2022-08-23 DIAGNOSIS — R400 Somnolence: Secondary | ICD-10-CM | POA: Diagnosis not present

## 2022-08-23 DIAGNOSIS — G4733 Obstructive sleep apnea (adult) (pediatric): Secondary | ICD-10-CM | POA: Diagnosis not present

## 2022-08-31 DIAGNOSIS — F4321 Adjustment disorder with depressed mood: Secondary | ICD-10-CM | POA: Diagnosis not present

## 2022-09-05 DIAGNOSIS — E7800 Pure hypercholesterolemia, unspecified: Secondary | ICD-10-CM | POA: Diagnosis not present

## 2022-09-05 DIAGNOSIS — D649 Anemia, unspecified: Secondary | ICD-10-CM | POA: Diagnosis not present

## 2022-09-05 DIAGNOSIS — R7303 Prediabetes: Secondary | ICD-10-CM | POA: Diagnosis not present

## 2022-09-06 DIAGNOSIS — E039 Hypothyroidism, unspecified: Secondary | ICD-10-CM | POA: Diagnosis not present

## 2022-09-06 DIAGNOSIS — E559 Vitamin D deficiency, unspecified: Secondary | ICD-10-CM | POA: Diagnosis not present

## 2022-09-06 DIAGNOSIS — R7303 Prediabetes: Secondary | ICD-10-CM | POA: Diagnosis not present

## 2022-09-06 DIAGNOSIS — Z713 Dietary counseling and surveillance: Secondary | ICD-10-CM | POA: Diagnosis not present

## 2022-09-06 DIAGNOSIS — I10 Essential (primary) hypertension: Secondary | ICD-10-CM | POA: Diagnosis not present

## 2022-09-07 DIAGNOSIS — F4321 Adjustment disorder with depressed mood: Secondary | ICD-10-CM | POA: Diagnosis not present

## 2022-09-12 ENCOUNTER — Encounter: Payer: Self-pay | Admitting: Internal Medicine

## 2022-09-12 ENCOUNTER — Ambulatory Visit: Payer: Medicare PPO | Attending: Internal Medicine | Admitting: Internal Medicine

## 2022-09-12 VITALS — BP 118/72 | HR 71 | Ht 61.0 in | Wt 155.6 lb

## 2022-09-12 DIAGNOSIS — Z01812 Encounter for preprocedural laboratory examination: Secondary | ICD-10-CM

## 2022-09-12 DIAGNOSIS — I358 Other nonrheumatic aortic valve disorders: Secondary | ICD-10-CM | POA: Diagnosis not present

## 2022-09-12 NOTE — Progress Notes (Signed)
Cardiology Office Note:    Date:  09/12/2022   ID:  Suzanne Kirby, DOB 1945-07-16, MRN [id number]  PCP:  Thana Ates, MD   [facility] Providers Cardiologist:  None     Referring MD: Thana Ates, MD   No chief complaint on file. Mass adjacent to the AV  History of Present Illness:    Suzanne Kirby is a 77 y.o. 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 She was started on plavix and asa, continue statin. She had a calcified mass near the [facility] on her TTE. No prior cardiac hx. She has her native valve.  She's had a prior w/y at [facility]. She had prior stress tests and TTE that were normal. She had prior cardiac monitor that was also normal.  She has zoster.  No esophageal pathology.   Current Medications: Current Outpatient Medications on File Prior to Visit  Medication Sig Dispense Refill   amLODipine (NORVASC) 5 MG tablet Take 5 mg by mouth daily.     aspirin EC 81 MG tablet Take 1 tablet (81 mg total) by mouth daily. Swallow whole. 30 tablet 12   atorvastatin (LIPITOR) 20 MG tablet Take 1 tablet (20 mg total) by mouth daily. 90 tablet 1   CALCIUM PO Take 1 tablet by mouth in the morning and at bedtime.     cholecalciferol (VITAMIN D3) 25 MCG (1000 UNIT) tablet Take 1,000 Units by mouth in the morning and at bedtime.     famotidine (PEPCID) 20 MG tablet Take 20 mg by mouth daily.     ferrous sulfate 325 (65 FE) MG EC tablet Take 325 mg by mouth every other day.     losartan (COZAAR) 25 MG tablet Take 25 mg by mouth in the morning and at bedtime.     SODIUM FLUORIDE 5000 PPM 1.1 % PSTE Take 1 Application by mouth at bedtime.     SYNTHROID 75 MCG tablet Take 75 mcg by mouth every morning.     valACYclovir (VALTREX) 500 MG tablet Take 500 mg by mouth 2 (two) times daily.     fluocinonide (LIDEX) 0.05 % external solution Apply topically 2 (two) times a week. (Patient  not taking: Reported on 07/28/2022)     No current facility-administered medications on file prior to visit.     Allergies:   Sulfa antibiotics   Social History   Socioeconomic History   Marital status: Single    Spouse name: Not on file   Number of children: Not on file   Years of education: Not on file   Highest education level: Not on file  Occupational History   Not on file  Tobacco Use   Smoking status: Never   Smokeless tobacco: Never  Vaping Use   Vaping Use: Never used  Substance and Sexual Activity   Alcohol use: Not Currently   Drug use: Never   Sexual activity: Not on file  Other Topics Concern   Not on file  Social History Narrative   Not on file   Social Determinants of Health   Financial Resource Strain: Not on file  Food Insecurity: No Food Insecurity (07/27/2022)   Hunger Vital Sign    Worried About Running Out of Food in the Last Year: Never true    Ran Out of Food in the Last Year: Never true  Transportation Needs: No  Transportation Needs (07/27/2022)   PRAPARE - Administrator, Civil Service (Medical): No    Lack of Transportation (Non-Medical): No  Physical Activity: Not on file  Stress: Not on file  Social Connections: Not on file     Family History: NA  ROS:   Please see the history of present illness.     All other systems reviewed and are negative.  EKGs/Labs/Other Studies Reviewed:    The following studies were reviewed today:   EKG:    Recent Labs: 07/27/2022: ALT 14; BUN 21; Creatinine, Ser 1.01; Hemoglobin 11.7; Platelets 308; Potassium 4.2; Sodium 139 07/28/2022: TSH 1.772   Recent Lipid Panel    Component Value Date/Time   CHOL 139 07/28/2022 0642   TRIG 107 07/28/2022 0642   HDL 26 (L) 07/28/2022 0642   CHOLHDL 5.3 07/28/2022 0642   VLDL 21 07/28/2022 0642   LDLCALC 92 07/28/2022 0642     Risk Assessment/Calculations:     Physical Exam:    VS Vitals:   09/12/22 1130  BP: 118/72  Pulse: 71   SpO2: 96%    Wt Readings from Last 3 Encounters:  09/12/22 155 lb 9.6 oz (70.6 kg)  07/28/22 172 lb 9.9 oz (78.3 kg)  03/29/22 153 lb (69.4 kg)     GEN:  Well nourished, well developed in no acute distress HEENT: Normal NECK: No JVD CARDIAC: RRR, no murmurs, rubs, gallops RESPIRATORY:  Clear to auscultation without rales, wheezing or rhonchi  ABDOMEN: Soft, non-tender, non-distended MUSCULOSKELETAL:  No edema; No deformity  SKIN: Warm and dry NEUROLOGIC:  Alert and oriented x 3 PSYCHIATRIC:  Normal affect   ASSESSMENT:    Mass adjacent to AV: Looks calcified, possible artifact. Will plan for a TEE. PLAN:    In order of problems listed above:  TEE Follow up PRN      Informed Consent   Shared Decision Making/Informed Consent The risks [esophageal damage, perforation (1:10,000 risk), bleeding, pharyngeal hematoma as well as other potential complications associated with conscious sedation including aspiration, arrhythmia, respiratory failure and death], benefits (treatment guidance and diagnostic support) and alternatives of a transesophageal echocardiogram were discussed in detail with Suzanne Kirby and she is willing to proceed.        Medication Adjustments/Labs and Tests Ordered: Current medicines are reviewed at length with the patient today.  Concerns regarding medicines are outlined above.  Orders Placed This Encounter  Procedures   Basic metabolic panel   EKG 12-Lead   No orders of the defined types were placed in this encounter.   Patient Instructions  Medication Instructions:  No medication changes  If you need a refill on your cardiac medications before your next appointment, please call your pharmacy*   Lab Work: No labs ordered If you have labs (blood work) drawn today and your tests are completely normal, you will receive your results only by: MyChart Message (if you have MyChart) OR A paper copy in the mail If you have any lab test that is  abnormal or we need to change your treatment, we will call you to review the results.   Testing/Procedures:      Dear Suzanne Kirby  You are scheduled for a TEE (Transesophageal Echocardiogram) on Friday, June 21 with Dr. Izora Ribas.  Please arrive at the [facility] (Main Entrance A) at [facility]: [address] [place], [place] [address] at 7:30 AM (This time is 1 hour(s) before your procedure to ensure your preparation). Free valet parking  service is available. You will check in at ADMITTING. The support person will be asked to wait in the waiting room.  It is OK to have someone drop you off and come back when you are ready to be discharged.      DIET:  Nothing to eat or drink after midnight except a sip of water with medications (see medication instructions below)  MEDICATION INSTRUCTIONS: !!IF ANY NEW MEDICATIONS ARE STARTED AFTER TODAY, PLEASE NOTIFY YOUR PROVIDER AS SOON AS POSSIBLE!!  FYI: Medications such as Semaglutide (Ozempic, [place]), Tirzepatide (Mounjaro, Zepbound), Dulaglutide (Trulicity), etc ("GLP1 agonists") AND Canagliflozin (Invokana), Dapagliflozin (Farxiga), Empagliflozin (Jardiance), Ertugliflozin (Steglatro), Bexagliflozin [employer]) or any combination with one of these drugs such as Invokamet (Canagliflozin/Metformin), Synjardy (Empagliflozin/Metformin), etc ("SGLT2 inhibitors") must be held around the time of a procedure. This is not a comprehensive list of all of these drugs. Please review all of your medications and talk to your provider if you take any one of these. If you are not sure, ask your provider.    LABS:   Drawn today CBC and BMET  FYI:  For your safety, and to allow [place] to monitor your vital signs accurately during the surgery/procedure we request: If you have artificial nails, gel coating, SNS etc, please have those removed prior to your surgery/procedure. Not having the nail coverings /polish removed may result in cancellation or  delay of your surgery/procedure.  You must have a responsible person to drive you home and stay in the waiting area during your procedure. Failure to do so could result in cancellation.  Bring your insurance cards.  *Special Note: Every effort is made to have your procedure done on time. Occasionally there are emergencies that occur at the hospital that may cause delays. Please be patient if a delay does occur.     Follow-Up: At [facility], you and your health needs are our priority.  As part of our continuing mission to provide you with exceptional heart care, we have created designated Provider Care Teams.  These Care Teams include your primary Cardiologist (physician) and Advanced Practice Providers (APPs -  Physician Assistants and Nurse Practitioners) who all work together to provide you with the care you need, when you need it.  We recommend signing up for the patient portal called "MyChart".  Sign up information is provided on this After Visit Summary.  MyChart is used to connect with patients for Virtual Visits (Telemedicine).  Patients are able to view lab/test results, encounter notes, upcoming appointments, etc.  Non-urgent messages can be sent to your provider as well.   To learn more about what you can do with MyChart, go to [email or web address].    Your next appointment:   As needed      Signed, Maisie Fus, MD  09/12/2022 12:35 PM    [facility]

## 2022-09-12 NOTE — Patient Instructions (Addendum)
Medication Instructions:  No medication changes  If you need a refill on your cardiac medications before your next appointment, please call your pharmacy*   Lab Work: No labs ordered If you have labs (blood work) drawn today and your tests are completely normal, you will receive your results only by: MyChart Message (if you have MyChart) OR A paper copy in the mail If you have any lab test that is abnormal or we need to change your treatment, we will call you to review the results.   Testing/Procedures:      Dear Suzanne Kirby  You are scheduled for a TEE (Transesophageal Echocardiogram) on Friday, June 21 with Dr. Izora Ribas.  Please arrive at the [facility] (Main Entrance A) at [facility]: [address] [place], [place] [address] at 7:30 AM (This time is 1 hour(s) before your procedure to ensure your preparation). Free valet parking service is available. You will check in at ADMITTING. The support person will be asked to wait in the waiting room.  It is OK to have someone drop you off and come back when you are ready to be discharged.      DIET:  Nothing to eat or drink after midnight except a sip of water with medications (see medication instructions below)  MEDICATION INSTRUCTIONS: !!IF ANY NEW MEDICATIONS ARE STARTED AFTER TODAY, PLEASE NOTIFY YOUR PROVIDER AS SOON AS POSSIBLE!!  FYI: Medications such as Semaglutide (Ozempic, [place]), Tirzepatide (Mounjaro, Zepbound), Dulaglutide (Trulicity), etc ("GLP1 agonists") AND Canagliflozin (Invokana), Dapagliflozin (Farxiga), Empagliflozin (Jardiance), Ertugliflozin (Steglatro), Bexagliflozin [employer]) or any combination with one of these drugs such as Invokamet (Canagliflozin/Metformin), Synjardy (Empagliflozin/Metformin), etc ("SGLT2 inhibitors") must be held around the time of a procedure. This is not a comprehensive list of all of these drugs. Please review all of your medications and talk to your provider if you take  any one of these. If you are not sure, ask your provider.    LABS:   Drawn today CBC and BMET  FYI:  For your safety, and to allow [place] to monitor your vital signs accurately during the surgery/procedure we request: If you have artificial nails, gel coating, SNS etc, please have those removed prior to your surgery/procedure. Not having the nail coverings /polish removed may result in cancellation or delay of your surgery/procedure.  You must have a responsible person to drive you home and stay in the waiting area during your procedure. Failure to do so could result in cancellation.  Bring your insurance cards.  *Special Note: Every effort is made to have your procedure done on time. Occasionally there are emergencies that occur at the hospital that may cause delays. Please be patient if a delay does occur.     Follow-Up: At [facility], you and your health needs are our priority.  As part of our continuing mission to provide you with exceptional heart care, we have created designated Provider Care Teams.  These Care Teams include your primary Cardiologist (physician) and Advanced Practice Providers (APPs -  Physician Assistants and Nurse Practitioners) who all work together to provide you with the care you need, when you need it.  We recommend signing up for the patient portal called "MyChart".  Sign up information is provided on this After Visit Summary.  MyChart is used to connect with patients for Virtual Visits (Telemedicine).  Patients are able to view lab/test results, encounter notes, upcoming appointments, etc.  Non-urgent messages can be sent to your provider as well.   To  learn more about what you can do with MyChart, go to [email or web address].    Your next appointment:   As needed

## 2022-09-12 NOTE — H&P (View-Only) (Signed)
Cardiology Office Note:    Date:  09/12/2022   ID:  Suzanne Kirby, DOB 09/26/1945, MRN [id number]  PCP:  Raju, Sneha P, MD   [facility] Providers Cardiologist:  None     Referring MD: Raju, Sneha P, MD   No chief complaint on file. Mass adjacent to the AV  History of Present Illness:    Suzanne Kirby is a 77 y.o. 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 She was started on plavix and asa, continue statin. She had a calcified mass near the [facility] on her TTE. No prior cardiac hx. She has her native valve.  She's had a prior w/y at [facility]. She had prior stress tests and TTE that were normal. She had prior cardiac monitor that was also normal.  She has zoster.  No esophageal pathology.   Current Medications: Current Outpatient Medications on File Prior to Visit  Medication Sig Dispense Refill   amLODipine (NORVASC) 5 MG tablet Take 5 mg by mouth daily.     aspirin EC 81 MG tablet Take 1 tablet (81 mg total) by mouth daily. Swallow whole. 30 tablet 12   atorvastatin (LIPITOR) 20 MG tablet Take 1 tablet (20 mg total) by mouth daily. 90 tablet 1   CALCIUM PO Take 1 tablet by mouth in the morning and at bedtime.     cholecalciferol (VITAMIN D3) 25 MCG (1000 UNIT) tablet Take 1,000 Units by mouth in the morning and at bedtime.     famotidine (PEPCID) 20 MG tablet Take 20 mg by mouth daily.     ferrous sulfate 325 (65 FE) MG EC tablet Take 325 mg by mouth every other day.     losartan (COZAAR) 25 MG tablet Take 25 mg by mouth in the morning and at bedtime.     SODIUM FLUORIDE 5000 PPM 1.1 % PSTE Take 1 Application by mouth at bedtime.     SYNTHROID 75 MCG tablet Take 75 mcg by mouth every morning.     valACYclovir (VALTREX) 500 MG tablet Take 500 mg by mouth 2 (two) times daily.     fluocinonide (LIDEX) 0.05 % external solution Apply topically 2 (two) times a week. (Patient  not taking: Reported on 07/28/2022)     No current facility-administered medications on file prior to visit.     Allergies:   Sulfa antibiotics   Social History   Socioeconomic History   Marital status: Single    Spouse name: Not on file   Number of children: Not on file   Years of education: Not on file   Highest education level: Not on file  Occupational History   Not on file  Tobacco Use   Smoking status: Never   Smokeless tobacco: Never  Vaping Use   Vaping Use: Never used  Substance and Sexual Activity   Alcohol use: Not Currently   Drug use: Never   Sexual activity: Not on file  Other Topics Concern   Not on file  Social History Narrative   Not on file   Social Determinants of Health   Financial Resource Strain: Not on file  Food Insecurity: No Food Insecurity (07/27/2022)   Hunger Vital Sign    Worried About Running Out of Food in the Last Year: Never true    Ran Out of Food in the Last Year: Never true  Transportation Needs: No   Transportation Needs (07/27/2022)   PRAPARE - Transportation    Lack of Transportation (Medical): No    Lack of Transportation (Non-Medical): No  Physical Activity: Not on file  Stress: Not on file  Social Connections: Not on file     Family History: NA  ROS:   Please see the history of present illness.     All other systems reviewed and are negative.  EKGs/Labs/Other Studies Reviewed:    The following studies were reviewed today:   EKG:    Recent Labs: 07/27/2022: ALT 14; BUN 21; Creatinine, Ser 1.01; Hemoglobin 11.7; Platelets 308; Potassium 4.2; Sodium 139 07/28/2022: TSH 1.772   Recent Lipid Panel    Component Value Date/Time   CHOL 139 07/28/2022 0642   TRIG 107 07/28/2022 0642   HDL 26 (L) 07/28/2022 0642   CHOLHDL 5.3 07/28/2022 0642   VLDL 21 07/28/2022 0642   LDLCALC 92 07/28/2022 0642     Risk Assessment/Calculations:     Physical Exam:    VS Vitals:   09/12/22 1130  BP: 118/72  Pulse: 71   SpO2: 96%    Wt Readings from Last 3 Encounters:  09/12/22 155 lb 9.6 oz (70.6 kg)  07/28/22 172 lb 9.9 oz (78.3 kg)  03/29/22 153 lb (69.4 kg)     GEN:  Well nourished, well developed in no acute distress HEENT: Normal NECK: No JVD CARDIAC: RRR, no murmurs, rubs, gallops RESPIRATORY:  Clear to auscultation without rales, wheezing or rhonchi  ABDOMEN: Soft, non-tender, non-distended MUSCULOSKELETAL:  No edema; No deformity  SKIN: Warm and dry NEUROLOGIC:  Alert and oriented x 3 PSYCHIATRIC:  Normal affect   ASSESSMENT:    Mass adjacent to AV: Looks calcified, possible artifact. Will plan for a TEE. PLAN:    In order of problems listed above:  TEE Follow up PRN      Informed Consent   Shared Decision Making/Informed Consent The risks [esophageal damage, perforation (1:10,000 risk), bleeding, pharyngeal hematoma as well as other potential complications associated with conscious sedation including aspiration, arrhythmia, respiratory failure and death], benefits (treatment guidance and diagnostic support) and alternatives of a transesophageal echocardiogram were discussed in detail with Ms. Bille and she is willing to proceed.        Medication Adjustments/Labs and Tests Ordered: Current medicines are reviewed at length with the patient today.  Concerns regarding medicines are outlined above.  Orders Placed This Encounter  Procedures   Basic metabolic panel   EKG 12-Lead   No orders of the defined types were placed in this encounter.   Patient Instructions  Medication Instructions:  No medication changes  If you need a refill on your cardiac medications before your next appointment, please call your pharmacy*   Lab Work: No labs ordered If you have labs (blood work) drawn today and your tests are completely normal, you will receive your results only by: MyChart Message (if you have MyChart) OR A paper copy in the mail If you have any lab test that is  abnormal or we need to change your treatment, we will call you to review the results.   Testing/Procedures:      Dear Janesa Karn  You are scheduled for a TEE (Transesophageal Echocardiogram) on Friday, June 21 with Dr. Chandrasekhar.  Please arrive at the [place] [facility] (Main Entrance A) at [facility]: [address] [place], [place] [address] at 7:30 AM (This time is 1 hour(s) before your procedure to ensure your preparation). Free valet parking   service is available. You will check in at ADMITTING. The support person will be asked to wait in the waiting room.  It is OK to have someone drop you off and come back when you are ready to be discharged.      DIET:  Nothing to eat or drink after midnight except a sip of water with medications (see medication instructions below)  MEDICATION INSTRUCTIONS: !!IF ANY NEW MEDICATIONS ARE STARTED AFTER TODAY, PLEASE NOTIFY YOUR PROVIDER AS SOON AS POSSIBLE!!  FYI: Medications such as Semaglutide (Ozempic, [place]), Tirzepatide (Mounjaro, Zepbound), Dulaglutide (Trulicity), etc ("GLP1 agonists") AND Canagliflozin (Invokana), Dapagliflozin (Farxiga), Empagliflozin (Jardiance), Ertugliflozin (Steglatro), Bexagliflozin ([place]) or any combination with one of these drugs such as Invokamet (Canagliflozin/Metformin), Synjardy (Empagliflozin/Metformin), etc ("SGLT2 inhibitors") must be held around the time of a procedure. This is not a comprehensive list of all of these drugs. Please review all of your medications and talk to your provider if you take any one of these. If you are not sure, ask your provider.    LABS:   Drawn today CBC and BMET  FYI:  For your safety, and to allow us to monitor your vital signs accurately during the surgery/procedure we request: If you have artificial nails, gel coating, SNS etc, please have those removed prior to your surgery/procedure. Not having the nail coverings /polish removed may result in cancellation or  delay of your surgery/procedure.  You must have a responsible person to drive you home and stay in the waiting area during your procedure. Failure to do so could result in cancellation.  Bring your insurance cards.  *Special Note: Every effort is made to have your procedure done on time. Occasionally there are emergencies that occur at the hospital that may cause delays. Please be patient if a delay does occur.     Follow-Up: At [facility], you and your health needs are our priority.  As part of our continuing mission to provide you with exceptional heart care, we have created designated Provider Care Teams.  These Care Teams include your primary Cardiologist (physician) and Advanced Practice Providers (APPs -  Physician Assistants and Nurse Practitioners) who all work together to provide you with the care you need, when you need it.  We recommend signing up for the patient portal called "MyChart".  Sign up information is provided on this After Visit Summary.  MyChart is used to connect with patients for Virtual Visits (Telemedicine).  Patients are able to view lab/test results, encounter notes, upcoming appointments, etc.  Non-urgent messages can be sent to your provider as well.   To learn more about what you can do with MyChart, go to [email or web address].    Your next appointment:   As needed      Signed, Crescentia Boutwell E, MD  09/12/2022 12:35 PM    [facility] 

## 2022-09-13 LAB — BASIC METABOLIC PANEL
BUN/Creatinine Ratio: 22 (ref 12–28)
BUN: 17 mg/dL (ref 8–27)
CO2: 22 mmol/L (ref 20–29)
Calcium: 9.4 mg/dL (ref 8.7–10.3)
Chloride: 105 mmol/L (ref 96–106)
Creatinine, Ser: 0.78 mg/dL (ref 0.57–1.00)
Glucose: 86 mg/dL (ref 70–99)
Potassium: 4.7 mmol/L (ref 3.5–5.2)
Sodium: 141 mmol/L (ref 134–144)
eGFR: 78 mL/min/{1.73_m2} (ref >59)

## 2022-09-14 DIAGNOSIS — F4321 Adjustment disorder with depressed mood: Secondary | ICD-10-CM | POA: Diagnosis not present

## 2022-09-21 ENCOUNTER — Telehealth: Payer: Self-pay | Admitting: Internal Medicine

## 2022-09-21 DIAGNOSIS — F4321 Adjustment disorder with depressed mood: Secondary | ICD-10-CM | POA: Diagnosis not present

## 2022-09-21 NOTE — Anesthesia Preprocedure Evaluation (Addendum)
Anesthesia Evaluation  Patient identified by MRN, date of birth, ID band Patient awake    Reviewed: Allergy & Precautions, NPO status , Patient's Chart, lab work & pertinent test results  History of Anesthesia Complications Negative for: history of anesthetic complications  Airway Mallampati: III  TM Distance: >3 FB Neck ROM: Full    Dental  (+) Dental Advisory Given Bridge across top front teeth:   Pulmonary neg shortness of breath, sleep apnea and Continuous Positive Airway Pressure Ventilation , neg COPD, neg recent URI   Pulmonary exam normal breath sounds clear to auscultation       Cardiovascular hypertension (amlodipine, losartan), Pt. on medications (-) angina (-) Past MI, (-) Cardiac Stents and (-) CABG (-) dysrhythmias  Rhythm:Regular Rate:Normal  Cardiac mass, HLD  TTE 07/28/2022: IMPRESSIONS     1. Left ventricular ejection fraction, by estimation, is 60 to 65%. The  left ventricle has normal function. The left ventricle has no regional  wall motion abnormalities. There is mild left ventricular hypertrophy.  Left ventricular diastolic parameters  are consistent with Grade I diastolic dysfunction (impaired relaxation).   2. Right ventricular systolic function is normal. The right ventricular  size is normal. Tricuspid regurgitation signal is inadequate for assessing  PA pressure.   3. Left atrial size was mildly dilated.   4. No evidence of mitral valve regurgitation.   5. The aortic valve was not well visualized. Aortic valve regurgitation  is not visualized.   6. The inferior vena cava is normal in size with greater than 50%  respiratory variability, suggesting right atrial pressure of 3 mmHg.   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 Appears calcified.  Consider TEE if embolic source is speculated.     Neuro/Psych neg  Seizures TIA (stenosis of intracranial artery)   GI/Hepatic Neg liver ROS,GERD  Medicated,,  Endo/Other  neg diabetesHypothyroidism    Renal/GU negative Renal ROS     Musculoskeletal   Abdominal   Peds  Hematology negative hematology ROS (+)   Anesthesia Other Findings 77 y.o. 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 She was started on plavix (x21 days) and asa, continue statin. She had a calcified mass near the [facility] on her TTE. No prior cardiac hx.  Reproductive/Obstetrics                             Anesthesia Physical Anesthesia Plan  ASA: 3  Anesthesia Plan: MAC   Post-op Pain Management: Minimal or no pain anticipated   Induction: Intravenous  PONV Risk Score and Plan: 2 and Propofol infusion and Treatment may vary due to age or medical condition  Airway Management Planned: Natural Airway and Nasal Cannula  Additional Equipment:   Intra-op Plan:   Post-operative Plan:   Informed Consent: I have reviewed the patients History and Physical, chart, labs and discussed the procedure including the risks, benefits and alternatives for the proposed anesthesia with the patient or authorized representative who has indicated his/her understanding and acceptance.     Dental advisory given  Plan Discussed with: Anesthesiologist and CRNA  Anesthesia Plan Comments: (Discussed with patient risks of MAC including, but not limited to, minor pain or discomfort, hearing people in the room, and possible need for backup general  anesthesia. Risks for general anesthesia also discussed including, but not limited to, sore throat, hoarse voice, chipped/damaged teeth, injury to vocal cords, nausea and vomiting, allergic reactions, lung infection, heart attack, stroke, and death. All questions answered. )       Anesthesia Quick Evaluation

## 2022-09-21 NOTE — Pre-Procedure Instructions (Signed)
Spoke to patient on phone regarding TEE tomorrow:  Instructed patient to arrive at 0730, NPO after midnight  Confirmed patient has ride home but pt stated she will call her niece to ensure that she can stay with her for 24 hours after the procedure, will back if she has trouble with this  Instructed patient to take pills in the AM with a sip of water

## 2022-09-21 NOTE — Pre-Procedure Instructions (Signed)
Pt called back regarding someone staying with her for 24 hours after TEE tomorrow, transferred to manager, [employer]

## 2022-09-21 NOTE — Telephone Encounter (Signed)
Call to patient, straight to VM.  LM to call office 

## 2022-09-21 NOTE — Telephone Encounter (Signed)
Pt would like a callback regarding whether she should take her Asprin or BP medication before procedure tomorrow. Please advise.

## 2022-09-22 ENCOUNTER — Ambulatory Visit (HOSPITAL_BASED_OUTPATIENT_CLINIC_OR_DEPARTMENT_OTHER)
Admission: RE | Admit: 2022-09-22 | Discharge: 2022-09-22 | Disposition: A | Discharge status: Home / Self Care | Payer: Medicare PPO | Source: Ambulatory Visit | Attending: Internal Medicine | Admitting: Internal Medicine

## 2022-09-22 ENCOUNTER — Ambulatory Visit (HOSPITAL_COMMUNITY): Payer: Medicare PPO | Admitting: Registered Nurse

## 2022-09-22 ENCOUNTER — Encounter (HOSPITAL_COMMUNITY)
Admission: RE | Disposition: A | Discharge status: Home / Self Care | Payer: Self-pay | Source: Home / Self Care | Attending: Internal Medicine

## 2022-09-22 ENCOUNTER — Encounter (HOSPITAL_COMMUNITY): Payer: Self-pay | Admitting: Internal Medicine

## 2022-09-22 ENCOUNTER — Ambulatory Visit (HOSPITAL_COMMUNITY)
Admission: RE | Admit: 2022-09-22 | Discharge: 2022-09-22 | Disposition: A | Discharge status: Home / Self Care | Payer: Medicare PPO | Attending: Internal Medicine | Admitting: Internal Medicine

## 2022-09-22 ENCOUNTER — Other Ambulatory Visit: Payer: Self-pay

## 2022-09-22 DIAGNOSIS — I10 Essential (primary) hypertension: Secondary | ICD-10-CM | POA: Diagnosis not present

## 2022-09-22 DIAGNOSIS — I358 Other nonrheumatic aortic valve disorders: Secondary | ICD-10-CM | POA: Diagnosis not present

## 2022-09-22 DIAGNOSIS — R931 Abnormal findings on diagnostic imaging of heart and coronary circulation: Secondary | ICD-10-CM | POA: Diagnosis present

## 2022-09-22 DIAGNOSIS — Z8673 Personal history of transient ischemic attack (TIA), and cerebral infarction without residual deficits: Secondary | ICD-10-CM | POA: Diagnosis not present

## 2022-09-22 DIAGNOSIS — I3139 Other pericardial effusion (noninflammatory): Secondary | ICD-10-CM | POA: Insufficient documentation

## 2022-09-22 DIAGNOSIS — Z7902 Long term (current) use of antithrombotics/antiplatelets: Secondary | ICD-10-CM | POA: Diagnosis not present

## 2022-09-22 DIAGNOSIS — I119 Hypertensive heart disease without heart failure: Secondary | ICD-10-CM | POA: Diagnosis not present

## 2022-09-22 DIAGNOSIS — E785 Hyperlipidemia, unspecified: Secondary | ICD-10-CM | POA: Diagnosis not present

## 2022-09-22 DIAGNOSIS — Z7982 Long term (current) use of aspirin: Secondary | ICD-10-CM | POA: Diagnosis not present

## 2022-09-22 DIAGNOSIS — G4733 Obstructive sleep apnea (adult) (pediatric): Secondary | ICD-10-CM | POA: Diagnosis not present

## 2022-09-22 DIAGNOSIS — Z79899 Other long term (current) drug therapy: Secondary | ICD-10-CM | POA: Insufficient documentation

## 2022-09-22 DIAGNOSIS — Z9989 Dependence on other enabling machines and devices: Secondary | ICD-10-CM | POA: Diagnosis not present

## 2022-09-22 DIAGNOSIS — I700 Atherosclerosis of aorta: Secondary | ICD-10-CM | POA: Insufficient documentation

## 2022-09-22 DIAGNOSIS — E039 Hypothyroidism, unspecified: Secondary | ICD-10-CM

## 2022-09-22 HISTORY — PX: TEE WITHOUT CARDIOVERSION: SHX5443

## 2022-09-22 LAB — POCT I-STAT, CHEM 8
BUN: 25 mg/dL — ABNORMAL HIGH (ref 8–23)
Calcium, Ion: 1.24 mmol/L (ref 1.15–1.40)
Chloride: 107 mmol/L (ref 98–111)
Creatinine, Ser: 0.8 mg/dL (ref 0.44–1.00)
Glucose, Bld: 87 mg/dL (ref 70–99)
HCT: 39 % (ref 36.0–46.0)
Hemoglobin: 13.3 g/dL (ref 12.0–15.0)
Potassium: 4.2 mmol/L (ref 3.5–5.1)
Sodium: 142 mmol/L (ref 135–145)
TCO2: 30 mmol/L (ref 22–32)

## 2022-09-22 LAB — ECHO TEE
AV Mean grad: 5 mmHg
AV Peak grad: 8.3 mmHg
Ao pk vel: 1.44 m/s

## 2022-09-22 SURGERY — ECHOCARDIOGRAM, TRANSESOPHAGEAL
Anesthesia: Monitor Anesthesia Care

## 2022-09-22 MED ORDER — PROPOFOL 10 MG/ML IV BOLUS
INTRAVENOUS | Status: DC | PRN
  Administered 2022-09-22: 20 mg via INTRAVENOUS
  Administered 2022-09-22: 40 mg via INTRAVENOUS
  Administered 2022-09-22: 20 mg via INTRAVENOUS

## 2022-09-22 MED ORDER — PHENYLEPHRINE 80 MCG/ML (10ML) SYRINGE FOR IV PUSH (FOR BLOOD PRESSURE SUPPORT)
PREFILLED_SYRINGE | INTRAVENOUS | Status: DC | PRN
  Administered 2022-09-22: 80 ug via INTRAVENOUS
  Administered 2022-09-22: 160 ug via INTRAVENOUS

## 2022-09-22 MED ORDER — PROPOFOL 500 MG/50ML IV EMUL
INTRAVENOUS | Status: DC | PRN
  Administered 2022-09-22: 150 ug/kg/min via INTRAVENOUS

## 2022-09-22 MED ORDER — SODIUM CHLORIDE 0.9 % IV SOLN: INTRAVENOUS | Status: DC

## 2022-09-22 MED ORDER — LIDOCAINE 2% (20 MG/ML) 5 ML SYRINGE
INTRAMUSCULAR | Status: DC | PRN
  Administered 2022-09-22: 40 mg via INTRAVENOUS
  Administered 2022-09-22: 60 mg via INTRAVENOUS

## 2022-09-22 NOTE — Transfer of Care (Signed)
Immediate Anesthesia Transfer of Care Note  Patient: Suzanne Kirby  Procedure(s) Performed: TRANSESOPHAGEAL ECHOCARDIOGRAM  Patient Location: Cath Lab  Anesthesia Type:MAC  Level of Consciousness: drowsy  Airway & Oxygen Therapy: Patient Spontanous Breathing  Post-op Assessment: Report given to RN and Post -op Vital signs reviewed and stable  Post vital signs: Reviewed and stable  Last Vitals:  Vitals Value Taken Time  BP 110/72(82)   Temp    Pulse 76 09/22/22 0919  Resp 19 09/22/22 0919  SpO2 98 % 09/22/22 0919  Vitals shown include unvalidated device data.  Last Pain:  Vitals:   09/22/22 0800  TempSrc:   PainSc: 0-No pain         Complications: No notable events documented.

## 2022-09-22 NOTE — Anesthesia Postprocedure Evaluation (Signed)
Anesthesia Post Note  Patient: Suzanne Kirby  Procedure(s) Performed: TRANSESOPHAGEAL ECHOCARDIOGRAM     Patient location during evaluation: PACU Anesthesia Type: MAC Level of consciousness: awake Pain management: pain level controlled Vital Signs Assessment: post-procedure vital signs reviewed and stable Respiratory status: spontaneous breathing, nonlabored ventilation and respiratory function stable Cardiovascular status: stable and blood pressure returned to baseline Postop Assessment: no apparent nausea or vomiting Anesthetic complications: no   No notable events documented.  Last Vitals:  Vitals:   09/22/22 0741 09/22/22 0800  BP: (!) 171/76 (!) 159/71  Pulse: 65 64  Resp:  14  Temp: 36.5 C   SpO2: 98% 99%    Last Pain:  Vitals:   09/22/22 0800  TempSrc:   PainSc: 0-No pain                 Linton Rump

## 2022-09-22 NOTE — CV Procedure (Signed)
    TRANSESOPHAGEAL ECHOCARDIOGRAM   NAME:  Suzanne Kirby    MRN: [id number] DOB:  1945/04/28    ADMIT DATE: 09/22/2022  INDICATIONS: Cardiac Mass  PROCEDURE:   Informed consent was obtained prior to the procedure. The risks, benefits and alternatives for the procedure were discussed and the patient comprehended these risks.  Risks include, but are not limited to, cough, sore throat, vomiting, nausea, somnolence, esophageal and stomach trauma or perforation, bleeding, low blood pressure, aspiration, pneumonia, infection, trauma to the teeth and death.    Procedural time out performed. The oropharynx was anesthetized with topical 1% benzocaine.    Anesthesia was administered by Dr. Isaias Cowman and team.  The patient was administered a total of Propofol 300 mgto achieve and maintain moderate to deep conscious sedation.  The patient's heart rate, blood pressure, and oxygen saturation are monitored continuously during the procedure. The period of conscious sedation is 20 minutes, of which I was present face-to-face 100% of this time.   The transesophageal probe was inserted in the esophagus and stomach without difficulty and multiple views were obtained.   COMPLICATIONS:    There were no immediate complications.  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  Full report to follow. Further management per primary team.   Riley Lam, MD Reader  [facility], [facility]  9:19 AM

## 2022-09-22 NOTE — Progress Notes (Signed)
  Echocardiogram 2D Echocardiogram has been performed.  Delcie Roch 09/22/2022, 9:30 AM

## 2022-09-22 NOTE — Interval H&P Note (Signed)
History and Physical Interval Note:  09/22/2022 8:30 AM  Suzanne Kirby  has presented today for surgery, with the diagnosis of CARDIAC MASS.  The various methods of treatment have been discussed with the patient and family. After consideration of risks, benefits and other options for treatment, the patient has consented to  Procedure(s): TRANSESOPHAGEAL ECHOCARDIOGRAM (N/A) as a surgical intervention.  The patient's history has been reviewed, patient examined, no change in status, stable for surgery.  I have reviewed the patient's chart and labs.  Questions were answered to the patient's satisfaction.  Alternatives for this procedure would include Cardiac CT.   Doron Shake A Jeliyah Middlebrooks

## 2022-09-22 NOTE — Progress Notes (Signed)
  Echocardiogram Echocardiogram Transesophageal has been performed.  Delcie Roch 09/22/2022, 9:31 AM

## 2022-09-23 DIAGNOSIS — G4733 Obstructive sleep apnea (adult) (pediatric): Secondary | ICD-10-CM | POA: Diagnosis not present

## 2022-09-23 DIAGNOSIS — R400 Somnolence: Secondary | ICD-10-CM | POA: Diagnosis not present

## 2022-09-25 ENCOUNTER — Encounter (HOSPITAL_COMMUNITY): Payer: Self-pay | Admitting: Internal Medicine

## 2022-09-25 ENCOUNTER — Encounter: Payer: Self-pay | Admitting: Internal Medicine

## 2022-09-25 DIAGNOSIS — G4733 Obstructive sleep apnea (adult) (pediatric): Secondary | ICD-10-CM | POA: Diagnosis not present

## 2022-09-25 DIAGNOSIS — I10 Essential (primary) hypertension: Secondary | ICD-10-CM | POA: Diagnosis not present

## 2022-09-26 NOTE — Telephone Encounter (Signed)
Procedure has been completed.  Nothing needed for this message

## 2022-09-27 DIAGNOSIS — Z1231 Encounter for screening mammogram for malignant neoplasm of breast: Secondary | ICD-10-CM | POA: Diagnosis not present

## 2022-10-04 DIAGNOSIS — F4321 Adjustment disorder with depressed mood: Secondary | ICD-10-CM | POA: Diagnosis not present

## 2022-10-18 DIAGNOSIS — E559 Vitamin D deficiency, unspecified: Secondary | ICD-10-CM | POA: Diagnosis not present

## 2022-10-18 DIAGNOSIS — I10 Essential (primary) hypertension: Secondary | ICD-10-CM | POA: Diagnosis not present

## 2022-10-18 DIAGNOSIS — Z713 Dietary counseling and surveillance: Secondary | ICD-10-CM | POA: Diagnosis not present

## 2022-10-18 DIAGNOSIS — E039 Hypothyroidism, unspecified: Secondary | ICD-10-CM | POA: Diagnosis not present

## 2022-10-18 DIAGNOSIS — E7800 Pure hypercholesterolemia, unspecified: Secondary | ICD-10-CM | POA: Diagnosis not present

## 2022-10-19 DIAGNOSIS — F4321 Adjustment disorder with depressed mood: Secondary | ICD-10-CM | POA: Diagnosis not present

## 2022-10-23 DIAGNOSIS — R400 Somnolence: Secondary | ICD-10-CM | POA: Diagnosis not present

## 2022-10-23 DIAGNOSIS — G4733 Obstructive sleep apnea (adult) (pediatric): Secondary | ICD-10-CM | POA: Diagnosis not present

## 2022-10-31 DIAGNOSIS — R400 Somnolence: Secondary | ICD-10-CM | POA: Diagnosis not present

## 2022-10-31 DIAGNOSIS — G4733 Obstructive sleep apnea (adult) (pediatric): Secondary | ICD-10-CM | POA: Diagnosis not present

## 2022-11-07 DIAGNOSIS — H1789 Other corneal scars and opacities: Secondary | ICD-10-CM | POA: Diagnosis not present

## 2022-11-08 DIAGNOSIS — F4321 Adjustment disorder with depressed mood: Secondary | ICD-10-CM | POA: Diagnosis not present

## 2022-11-15 DIAGNOSIS — F4321 Adjustment disorder with depressed mood: Secondary | ICD-10-CM | POA: Diagnosis not present

## 2022-11-23 DIAGNOSIS — R400 Somnolence: Secondary | ICD-10-CM | POA: Diagnosis not present

## 2022-11-23 DIAGNOSIS — G4733 Obstructive sleep apnea (adult) (pediatric): Secondary | ICD-10-CM | POA: Diagnosis not present

## 2022-11-29 DIAGNOSIS — F4321 Adjustment disorder with depressed mood: Secondary | ICD-10-CM | POA: Diagnosis not present

## 2022-12-05 DIAGNOSIS — I10 Essential (primary) hypertension: Secondary | ICD-10-CM | POA: Diagnosis not present

## 2022-12-05 DIAGNOSIS — Z1331 Encounter for screening for depression: Secondary | ICD-10-CM | POA: Diagnosis not present

## 2022-12-05 DIAGNOSIS — M810 Age-related osteoporosis without current pathological fracture: Secondary | ICD-10-CM | POA: Diagnosis not present

## 2022-12-05 DIAGNOSIS — E7800 Pure hypercholesterolemia, unspecified: Secondary | ICD-10-CM | POA: Diagnosis not present

## 2022-12-05 DIAGNOSIS — Z79899 Other long term (current) drug therapy: Secondary | ICD-10-CM | POA: Diagnosis not present

## 2022-12-05 DIAGNOSIS — E559 Vitamin D deficiency, unspecified: Secondary | ICD-10-CM | POA: Diagnosis not present

## 2022-12-05 DIAGNOSIS — Z8673 Personal history of transient ischemic attack (TIA), and cerebral infarction without residual deficits: Secondary | ICD-10-CM | POA: Diagnosis not present

## 2022-12-05 DIAGNOSIS — Z0000 Encounter for general adult medical examination without abnormal findings: Secondary | ICD-10-CM | POA: Diagnosis not present

## 2022-12-05 DIAGNOSIS — E039 Hypothyroidism, unspecified: Secondary | ICD-10-CM | POA: Diagnosis not present

## 2022-12-05 DIAGNOSIS — D649 Anemia, unspecified: Secondary | ICD-10-CM | POA: Diagnosis not present

## 2022-12-05 DIAGNOSIS — R7303 Prediabetes: Secondary | ICD-10-CM | POA: Diagnosis not present

## 2022-12-14 DIAGNOSIS — F4321 Adjustment disorder with depressed mood: Secondary | ICD-10-CM | POA: Diagnosis not present

## 2022-12-24 DIAGNOSIS — G4733 Obstructive sleep apnea (adult) (pediatric): Secondary | ICD-10-CM | POA: Diagnosis not present

## 2022-12-24 DIAGNOSIS — R400 Somnolence: Secondary | ICD-10-CM | POA: Diagnosis not present

## 2022-12-28 DIAGNOSIS — F4321 Adjustment disorder with depressed mood: Secondary | ICD-10-CM | POA: Diagnosis not present

## 2023-01-10 DIAGNOSIS — L219 Seborrheic dermatitis, unspecified: Secondary | ICD-10-CM | POA: Diagnosis not present

## 2023-01-11 DIAGNOSIS — F4321 Adjustment disorder with depressed mood: Secondary | ICD-10-CM | POA: Diagnosis not present

## 2023-01-16 DIAGNOSIS — R400 Somnolence: Secondary | ICD-10-CM | POA: Diagnosis not present

## 2023-01-16 DIAGNOSIS — G4733 Obstructive sleep apnea (adult) (pediatric): Secondary | ICD-10-CM | POA: Diagnosis not present

## 2023-01-18 DIAGNOSIS — F4321 Adjustment disorder with depressed mood: Secondary | ICD-10-CM | POA: Diagnosis not present

## 2023-01-18 DIAGNOSIS — M810 Age-related osteoporosis without current pathological fracture: Secondary | ICD-10-CM | POA: Diagnosis not present

## 2023-01-18 DIAGNOSIS — Z23 Encounter for immunization: Secondary | ICD-10-CM | POA: Diagnosis not present

## 2023-01-18 DIAGNOSIS — Z87891 Personal history of nicotine dependence: Secondary | ICD-10-CM | POA: Diagnosis not present

## 2023-01-19 ENCOUNTER — Other Ambulatory Visit: Payer: Self-pay

## 2023-01-19 ENCOUNTER — Observation Stay (HOSPITAL_BASED_OUTPATIENT_CLINIC_OR_DEPARTMENT_OTHER)
Admission: EM | Admit: 2023-01-19 | Discharge: 2023-01-20 | Disposition: A | Discharge status: Home / Self Care | Payer: Medicare PPO | Attending: Internal Medicine | Admitting: Internal Medicine

## 2023-01-19 ENCOUNTER — Emergency Department (HOSPITAL_BASED_OUTPATIENT_CLINIC_OR_DEPARTMENT_OTHER): Payer: Medicare PPO

## 2023-01-19 ENCOUNTER — Inpatient Hospital Stay (HOSPITAL_COMMUNITY): Payer: Medicare PPO

## 2023-01-19 DIAGNOSIS — Z6830 Body mass index (BMI) 30.0-30.9, adult: Secondary | ICD-10-CM | POA: Diagnosis not present

## 2023-01-19 DIAGNOSIS — R479 Unspecified speech disturbances: Secondary | ICD-10-CM | POA: Diagnosis not present

## 2023-01-19 DIAGNOSIS — I700 Atherosclerosis of aorta: Secondary | ICD-10-CM | POA: Diagnosis not present

## 2023-01-19 DIAGNOSIS — I6782 Cerebral ischemia: Secondary | ICD-10-CM | POA: Diagnosis not present

## 2023-01-19 DIAGNOSIS — I10 Essential (primary) hypertension: Secondary | ICD-10-CM | POA: Diagnosis present

## 2023-01-19 DIAGNOSIS — E669 Obesity, unspecified: Secondary | ICD-10-CM | POA: Insufficient documentation

## 2023-01-19 DIAGNOSIS — R4701 Aphasia: Secondary | ICD-10-CM | POA: Diagnosis not present

## 2023-01-19 DIAGNOSIS — R29818 Other symptoms and signs involving the nervous system: Secondary | ICD-10-CM | POA: Diagnosis not present

## 2023-01-19 DIAGNOSIS — R471 Dysarthria and anarthria: Secondary | ICD-10-CM | POA: Diagnosis not present

## 2023-01-19 DIAGNOSIS — E782 Mixed hyperlipidemia: Secondary | ICD-10-CM

## 2023-01-19 DIAGNOSIS — H539 Unspecified visual disturbance: Secondary | ICD-10-CM | POA: Diagnosis not present

## 2023-01-19 DIAGNOSIS — I639 Cerebral infarction, unspecified: Principal | ICD-10-CM | POA: Diagnosis present

## 2023-01-19 DIAGNOSIS — Z79899 Other long term (current) drug therapy: Secondary | ICD-10-CM | POA: Diagnosis not present

## 2023-01-19 DIAGNOSIS — I6522 Occlusion and stenosis of left carotid artery: Secondary | ICD-10-CM | POA: Diagnosis not present

## 2023-01-19 DIAGNOSIS — E039 Hypothyroidism, unspecified: Secondary | ICD-10-CM | POA: Diagnosis present

## 2023-01-19 DIAGNOSIS — Z7982 Long term (current) use of aspirin: Secondary | ICD-10-CM | POA: Diagnosis not present

## 2023-01-19 DIAGNOSIS — I672 Cerebral atherosclerosis: Secondary | ICD-10-CM | POA: Diagnosis not present

## 2023-01-19 DIAGNOSIS — E785 Hyperlipidemia, unspecified: Secondary | ICD-10-CM | POA: Diagnosis present

## 2023-01-19 DIAGNOSIS — I6611 Occlusion and stenosis of right anterior cerebral artery: Secondary | ICD-10-CM | POA: Diagnosis not present

## 2023-01-19 DIAGNOSIS — R4781 Slurred speech: Secondary | ICD-10-CM | POA: Diagnosis not present

## 2023-01-19 HISTORY — DX: Hypothyroidism, unspecified: E03.9

## 2023-01-19 HISTORY — DX: Hyperlipidemia, unspecified: E78.5

## 2023-01-19 HISTORY — DX: Essential (primary) hypertension: I10

## 2023-01-19 LAB — CBG MONITORING, ED: Glucose-Capillary: 88 mg/dL (ref 70–99)

## 2023-01-19 LAB — DIFFERENTIAL
Abs Immature Granulocytes: 0.03 10*3/uL (ref 0.00–0.07)
Basophils Absolute: 0 10*3/uL (ref 0.0–0.1)
Basophils Relative: 0 %
Eosinophils Absolute: 0.2 10*3/uL (ref 0.0–0.5)
Eosinophils Relative: 2 %
Immature Granulocytes: 0 %
Lymphocytes Relative: 34 %
Lymphs Abs: 3 10*3/uL (ref 0.7–4.0)
Monocytes Absolute: 1.1 10*3/uL — ABNORMAL HIGH (ref 0.1–1.0)
Monocytes Relative: 12 %
Neutro Abs: 4.6 10*3/uL (ref 1.7–7.7)
Neutrophils Relative %: 52 %

## 2023-01-19 LAB — CBC
HCT: 41.4 % (ref 36.0–46.0)
Hemoglobin: 13.2 g/dL (ref 12.0–15.0)
MCH: 29.8 pg (ref 26.0–34.0)
MCHC: 31.9 g/dL (ref 30.0–36.0)
MCV: 93.5 fL (ref 80.0–100.0)
Platelets: 280 10*3/uL (ref 150–400)
RBC: 4.43 MIL/uL (ref 3.87–5.11)
RDW: 13.4 % (ref 11.5–15.5)
WBC: 9 10*3/uL (ref 4.0–10.5)
nRBC: 0 % (ref 0.0–0.2)

## 2023-01-19 LAB — COMPREHENSIVE METABOLIC PANEL
ALT: 12 U/L (ref 0–44)
AST: 20 U/L (ref 15–41)
Albumin: 3.9 g/dL (ref 3.5–5.0)
Alkaline Phosphatase: 127 U/L — ABNORMAL HIGH (ref 38–126)
Anion gap: 6 (ref 5–15)
BUN: 18 mg/dL (ref 8–23)
CO2: 26 mmol/L (ref 22–32)
Calcium: 9.8 mg/dL (ref 8.9–10.3)
Chloride: 106 mmol/L (ref 98–111)
Creatinine, Ser: 0.83 mg/dL (ref 0.44–1.00)
GFR, Estimated: >60 mL/min (ref >60)
Glucose, Bld: 104 mg/dL — ABNORMAL HIGH (ref 70–99)
Potassium: 4.1 mmol/L (ref 3.5–5.1)
Sodium: 138 mmol/L (ref 135–145)
Total Bilirubin: 0.7 mg/dL (ref 0.3–1.2)
Total Protein: 7.5 g/dL (ref 6.5–8.1)

## 2023-01-19 LAB — PROTIME-INR
INR: 0.9 (ref 0.8–1.2)
Prothrombin Time: 12.6 s (ref 11.4–15.2)

## 2023-01-19 LAB — ETHANOL: Alcohol, Ethyl (B): <10 mg/dL (ref <10)

## 2023-01-19 LAB — HEMOGLOBIN A1C
Hgb A1c MFr Bld: 5.7 % — ABNORMAL HIGH (ref 4.8–5.6)
Mean Plasma Glucose: 116.89 mg/dL

## 2023-01-19 LAB — APTT: aPTT: 29 s (ref 24–36)

## 2023-01-19 MED ORDER — IOHEXOL 350 MG/ML SOLN
75.0000 mL | Freq: Once | INTRAVENOUS | Status: AC | PRN
  Administered 2023-01-19: 75 mL via INTRAVENOUS

## 2023-01-19 MED ORDER — HYDRALAZINE HCL 20 MG/ML IJ SOLN: 10.0000 mg | INTRAMUSCULAR | Status: DC | PRN

## 2023-01-19 MED ORDER — SODIUM CHLORIDE 0.9% FLUSH: 3.0000 mL | Freq: Once | INTRAVENOUS | Status: DC

## 2023-01-19 MED ORDER — ACETAMINOPHEN 650 MG RE SUPP: 650.0000 mg | Freq: Four times a day (QID) | RECTAL | Status: DC | PRN

## 2023-01-19 MED ORDER — ATORVASTATIN CALCIUM 10 MG PO TABS
20.0000 mg | ORAL_TABLET | Freq: Every day | ORAL | Status: DC
  Administered 2023-01-19 – 2023-01-20 (×2): 20 mg via ORAL
  Filled 2023-01-19 (×2): qty 2

## 2023-01-19 MED ORDER — MELATONIN 3 MG PO TABS
3.0000 mg | ORAL_TABLET | Freq: Every evening | ORAL | Status: DC | PRN
  Filled 2023-01-19: qty 1

## 2023-01-19 MED ORDER — STROKE: EARLY STAGES OF RECOVERY BOOK
Freq: Once | Status: AC
  Filled 2023-01-19: qty 1

## 2023-01-19 MED ORDER — ASPIRIN 81 MG PO TBEC
81.0000 mg | DELAYED_RELEASE_TABLET | Freq: Every day | ORAL | Status: DC
  Administered 2023-01-20: 81 mg via ORAL
  Filled 2023-01-19: qty 1

## 2023-01-19 MED ORDER — ACETAMINOPHEN 325 MG PO TABS: 650.0000 mg | ORAL_TABLET | Freq: Four times a day (QID) | ORAL | Status: DC | PRN

## 2023-01-19 MED ORDER — ONDANSETRON HCL 4 MG/2ML IJ SOLN: 4.0000 mg | Freq: Four times a day (QID) | INTRAMUSCULAR | Status: DC | PRN

## 2023-01-19 NOTE — ED Notes (Signed)
Pt transported to CT by RN, RN with pt in CT at present.

## 2023-01-19 NOTE — Consult Note (Addendum)
[facility] TeleNeurology Consult Services    Date of Service:  01/19/2023       Metrics: Last Known Well: 3:45 AM Symptoms: As per HPI.  Patient is not  a candidate for thrombolytic. NIHSS 0.   Location of the provider: [facility]  Location of the patient: [facility] Time Code Stroke Page received:  8:49 AM Time neurologist arrived:  8:55 AM   This consult was provided via telemedicine with 2-way video and audio communication. The patient/family was informed that care would be provided in this way and agreed to receive care in this manner.   ED Physician notified of diagnostic impression and management plan.   Assessment: 77 year old female presenting with acute onset of double vision  - Symptomatically resolved at time of examination, except for speech being less fluent and clear that when she is at her baseline, per her niece who is present at the bedside. Given her high baseline level of intellectual functioning, her speech falls WNL on neurology exam despite not being at baseline per family.  - Several localizations are possible, including left perisylvian cortices or left thalamus in the event of stroke, as well as diffuse cerebral dysfunction secondary to possible mild metabolic encephalopathy.  - CT head: No evidence of acute intracranial abnormality. ASPECTS of 10. Moderate chronic small vessel ischemic disease. - EKG: Normal sinus rhythm; Low voltage QRS; Cannot rule out Anterior infarct (cited on or before 19-Jan-2023); T wave abnormality, consider lateral ischemia. When compared with ECG of 19-Jan-2023 08:38, T wave inversion now evident in Lateral leads; QT has shortened - Stroke risk factors: Prior strokes seen on imaging, recent TIA and HTN - Patient is not  a candidate for thrombolytic. NIHSS 0.     Recommendations: - Transfer to [facility] for TIA work up.  - HgbA1c, fasting lipid panel  - MRI of the brain without contrast  - CTA  of head and neck (eGFR > 60) - PT consult, OT consult, Speech consult - Echocardiogram - Continue ASA and atorvastatin. DAPT may be indicated based on pending CTA and MRI resulst - Modified permissive HTN protocol. Treat SBP if > 180.  - Risk factor modification - Telemetry monitoring - Frequent neuro checks - NPO until passes stroke swallow screen       ------------------------------------------------------------------------------   History of Present Illness: 77 year old female with a PMHx of remote lacunar infarcts diagnosed by imaging earlier this year, TIA in April of this year and HTN who presents to the ED with acute onset of double vision. She woke up feeling normal at 3:45 AM and then noticed symptoms of double vision while setting her bed. When she covered up one eye the double vision would resolve. She states that double vision was the same in all directions of gaze. She then tried to read something on her computer, but was unable to "because all of the letters were falling together". Her niece then called and noticed that the patient had slurred speech. She has felt dizzy briefly while in the ED. RLE weakness was also endorsed.   In April of this year she had presented to the hospital with right sided sensory numbness and was imaged at that time. 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 MRI brain in April revealed no acute intracranial abnormality, but there was moderately advanced chronic microvascular ischemic disease with a few remote lacunar infarcts about the bilateral basal ganglia and left thalamus at that time.    Past Medical History: As per HPI    Past Surgical History: Past Surgical History:  Procedure Laterality  Date   TEE WITHOUT CARDIOVERSION N/A 09/22/2022   Procedure: TRANSESOPHAGEAL ECHOCARDIOGRAM;  Surgeon: Christell Constant, MD;  Location: [facility] INVASIVE CV LAB;  Service: Cardiovascular;  Laterality: N/A;       Medications:  No current facility-administered medications on file prior to encounter.   Current Outpatient Medications on File Prior to Encounter  Medication Sig Dispense Refill   amLODipine (NORVASC) 5 MG tablet Take 5 mg by mouth daily.     aspirin EC 81 MG tablet Take 1 tablet (81 mg total) by mouth daily. Swallow whole. 30 tablet 12   atorvastatin (LIPITOR) 20 MG tablet Take 1 tablet (20 mg total) by mouth daily. 90 tablet 1   Calcium 250 MG CAPS Take 250 mg by mouth 2 (two) times daily.     Cholecalciferol (VITAMIN D) 50 MCG (2000 UT) tablet Take 2,000 Units by mouth 2 (two) times daily.     cyanocobalamin (VITAMIN B12) 1000 MCG tablet Take 1,000 mcg by mouth daily.     famotidine (PEPCID) 20 MG tablet Take 20 mg by mouth daily.     ferrous sulfate 325 (65 FE) MG EC tablet Take 325 mg by mouth every other day.     losartan (COZAAR) 50 MG tablet Take 25 mg by mouth 2 (two) times daily.     MAGNESIUM PO Take 1 tablet by mouth 2 (two) times daily.     Multiple Vitamins-Minerals (PRESERVISION AREDS 2) CAPS Take 1 capsule by mouth 2 (two) times daily.     SODIUM FLUORIDE 5000 PPM 1.1 % PSTE Take 1 Application by mouth at bedtime.     SYNTHROID 75 MCG tablet Take 75 mcg by mouth every morning.     valACYclovir (VALTREX) 500 MG tablet Take 500 mg by mouth 2 (two) times daily.           Social History: Drug Use: Never EtOH: Not currently   Family History:  Reviewed in Epic   ROS: As per HPI    Anticoagulant use:  None   Antiplatelet use: ASA   Examination:    BP (!) 161/81 (BP Location: Left Arm)   Pulse 78   Resp 14   SpO2 98%     1A: Level of Consciousness - 0 1B: Ask Month and Age - 0 1C: Blink Eyes & Squeeze Hands - 0 2: Test Horizontal Extraocular  Movements - 0 3: Test Visual Fields - 0 4: Test Facial Palsy (Use Grimace if Obtunded) - 0 5A: Test Left Arm Motor Drift - 0 5B: Test Right Arm Motor Drift - 0 6A: Test Left Leg Motor Drift - 0 6B: Test Right Leg Motor Drift - 0 7: Test Limb Ataxia (FNF/Heel-Shin) - 0 8: Test Sensation -  0 9: Test Language/Aphasia - 0 10: Test Dysarthria - Severe Dysarthria: 0 11: Test Extinction/Inattention - Extinction to bilateral simultaneous stimulation 0   NIHSS Score: 0     Patient/Family was informed the Neurology Consult would occur via TeleHealth consult by way of interactive audio and video telecommunications and consented to receiving care in this manner.   Patient is being evaluated for possible acute  neurologic impairment and high pretest probability of imminent or life-threatening deterioration. I spent total of 40 minutes providing care to this patient, including time for face to face visit via telemedicine, review of medical records, imaging studies and discussion of findings with providers, the patient and/or family.   Electronically signed: Dr. Caryl Pina

## 2023-01-19 NOTE — H&P (Incomplete)
History and Physical      Suzanne Kirby ZOX:[id number] DOB: 09-Jul-1945 DOA: 01/19/2023; DOS: 01/19/2023  PCP: Suzanne Ates, MD *** Patient coming from: home ***  I have personally briefly reviewed patient's old medical records in [facility]  Chief Complaint: ***  HPI: Suzanne Kirby is a 77 y.o. female with medical history significant for *** who is admitted to [facility] on 01/19/2023 with *** after presenting from home*** to [facility] ED complaining of ***.    ***       ***   ED Course:  Vital signs in the ED were notable for the following: ***  Labs were notable for the following: ***  Per my interpretation, EKG in ED demonstrated the following:  ***  Imaging in the ED, per corresponding formal radiology read, was notable for the following:  ***  EDP at [facility] discussed with on-call neurology, Dr. Otelia Kirby, who recommended [facility] admission for further TIA evaluation, and conveyed that neurology will formally consult.  While in the ED, the following were administered: ***  Subsequently, the patient was admitted  ***  ***red    Review of Systems: As per HPI otherwise 10 point review of systems negative.   No past medical history on file.  Past Surgical History:  Procedure Laterality Date   TEE WITHOUT CARDIOVERSION N/A 09/22/2022   Procedure: TRANSESOPHAGEAL ECHOCARDIOGRAM;  Surgeon: Christell Constant, MD;  Location: [facility] INVASIVE CV LAB;  Service: Cardiovascular;  Laterality: N/A;    Social History:  reports that she has never smoked. She has never used smokeless tobacco. She reports that she does not currently use alcohol. She reports that she does not use drugs.   Allergies  Allergen Reactions   Sulfa Antibiotics Rash and Other (See Comments)    Mouth sores     No family history on file.  Family history reviewed and not pertinent ***   Prior to Admission medications   Medication Sig Start Date End Date Taking? Authorizing  Provider  amLODipine (NORVASC) 5 MG tablet Take 5 mg by mouth daily.   Yes [provider]  aspirin EC 81 MG tablet Take 1 tablet (81 mg total) by mouth daily. Swallow whole. 07/29/22  Yes Almon Hercules, MD  atorvastatin (LIPITOR) 20 MG tablet Take 1 tablet (20 mg total) by mouth daily. 07/28/22  Yes Almon Hercules, MD  Calcium 250 MG CAPS Take 250 mg by mouth 2 (two) times daily.   Yes [provider]  Cholecalciferol (VITAMIN D) 50 MCG (2000 UT) tablet Take 2,000 Units by mouth in the morning and at bedtime.   Yes [provider]  cyanocobalamin (VITAMIN B12) 1000 MCG tablet Take 1,000 mcg by mouth daily.   Yes [provider]  famotidine (PEPCID) 20 MG tablet Take 20 mg by mouth daily. 05/15/22  Yes [provider]  ferrous sulfate 325 (65 FE) MG EC tablet Take 325 mg by mouth every other day.   Yes [provider]  ketoconazole (NIZORAL) 2 % cream Apply 1 Application topically daily. 01/10/23  Yes [provider]  losartan (COZAAR) 25 MG tablet Take 25 mg by mouth in the morning and at bedtime.   Yes [provider]  MAGNESIUM PO Take 1 tablet by mouth 2 (two) times daily.   Yes [provider]  metroNIDAZOLE (METROCREAM) 0.75 % cream Apply 1 Application topically daily. 11/20/22  Yes [provider]  Multiple Vitamins-Minerals (PRESERVISION AREDS 2) CAPS Take 1 capsule by  mouth 2 (two) times daily.   Yes [provider]  SODIUM FLUORIDE 5000 PPM 1.1 % PSTE Take 1 Application by mouth at bedtime. 04/03/22  Yes [provider]  SYNTHROID 75 MCG tablet Take 75 mcg by mouth every morning.   Yes [provider]     Objective    Physical Exam: Vitals:   01/19/23 1600 01/19/23 1825 01/19/23 1830 01/19/23 1939  BP: 135/68 (!) 157/76 (!) 152/63 (!) 163/76  Pulse:  87 84 73  Resp: 16 20 19 18   Temp:  97.7 F (36.5 C)  98.6 F (37 C)  TempSrc:  Oral  Oral  SpO2:  97% 95% 97%   Weight:        General: appears to be stated age; alert, oriented Skin: warm, dry, no rash Head:  AT/[place] Mouth:  Oral mucosa membranes appear moist, normal dentition Neck: supple; trachea midline Heart:  RRR; did not appreciate any M/R/G Lungs: CTAB, did not appreciate any wheezes, rales, or rhonchi Abdomen: + BS; soft, ND, NT Vascular: 2+ pedal pulses b/l; 2+ radial pulses b/l Extremities: no peripheral edema, no muscle wasting Neuro: strength and sensation intact in upper and lower extremities b/l ***   *** Neuro: 5/5 strength of the proximal and distal flexors and extensors of the upper and lower extremities bilaterally; sensation intact in upper and lower extremities b/l; cranial nerves II through XII grossly intact; no pronator drift; no evidence suggestive of slurred speech, dysarthria, or facial droop; Normal muscle tone. No tremors.  *** Neuro: In the setting of the patient's current mental status and associated inability to follow instructions, unable to perform full neurologic exam at this time.  As such, assessment of strength, sensation, and cranial nerves is limited at this time. Patient noted to spontaneously move all 4 extremities. No tremors.  ***    Labs on Admission: I have personally reviewed following labs and imaging studies  CBC: Recent Labs  Lab 01/19/23 0900  WBC 9.0  NEUTROABS 4.6  HGB 13.2  HCT 41.4  MCV 93.5  PLT 280   Basic Metabolic Panel: Recent Labs  Lab 01/19/23 0900  NA 138  K 4.1  CL 106  CO2 26  GLUCOSE 104*  BUN 18  CREATININE 0.83  CALCIUM 9.8   GFR: Estimated Creatinine Clearance: 51.1 mL/min (by C-G formula based on SCr of 0.83 mg/dL). Liver Function Tests: Recent Labs  Lab 01/19/23 0900  AST 20  ALT 12  ALKPHOS 127*  BILITOT 0.7  PROT 7.5  ALBUMIN 3.9   No results for input(s): "LIPASE", "AMYLASE" in the last 168 hours. No results for input(s): "AMMONIA" in the last 168 hours. Coagulation Profile: Recent Labs   Lab 01/19/23 0900  INR 0.9   Cardiac Enzymes: No results for input(s): "CKTOTAL", "CKMB", "CKMBINDEX", "TROPONINI" in the last 168 hours. BNP (last 3 results) No results for input(s): "PROBNP" in the last 8760 hours. HbA1C: No results for input(s): "HGBA1C" in the last 72 hours. CBG: Recent Labs  Lab 01/19/23 0842  GLUCAP 88   Lipid Profile: No results for input(s): "CHOL", "HDL", "LDLCALC", "TRIG", "CHOLHDL", "LDLDIRECT" in the last 72 hours. Thyroid Function Tests: No results for input(s): "TSH", "T4TOTAL", "FREET4", "T3FREE", "THYROIDAB" in the last 72 hours. Anemia Panel: No results for input(s): "VITAMINB12", "FOLATE", "FERRITIN", "TIBC", "IRON", "RETICCTPCT" in the last 72 hours. Urine analysis: No results found for: "COLORURINE", "APPEARANCEUR", "LABSPEC", "PHURINE", "GLUCOSEU", "HGBUR", "BILIRUBINUR", "KETONESUR", "PROTEINUR", "UROBILINOGEN", "NITRITE", "LEUKOCYTESUR"  Radiological Exams on Admission: CT ANGIO  HEAD NECK W WO CM  Result Date: 01/19/2023 CLINICAL DATA:  Stroke/TIA, determine embolic source. EXAM: CT ANGIOGRAPHY HEAD AND NECK WITH AND WITHOUT CONTRAST TECHNIQUE: Multidetector CT imaging of the head and neck was performed using the standard protocol during bolus administration of intravenous contrast. Multiplanar CT image reconstructions and MIPs were obtained to evaluate the vascular anatomy. Carotid stenosis measurements (when applicable) are obtained utilizing NASCET criteria, using the distal internal carotid diameter as the denominator. RADIATION DOSE REDUCTION: This exam was performed according to the departmental dose-optimization program which includes automated exposure control, adjustment of the mA and/or kV according to patient size and/or use of iterative reconstruction technique. CONTRAST:  75mL OMNIPAQUE IOHEXOL 350 MG/ML SOLN COMPARISON:  CT angiogram of the head and neck July 27, 2022. FINDINGS: CTA NECK FINDINGS Aortic arch: 4 vessel aortic arch  with direct origin of the left vertebral artery from the aortic arch. Imaged portion shows no evidence of aneurysm or dissection. Calcified atherosclerotic plaques in the aortic arch extending into the origin of the major neck arteries. Right carotid system: Calcified plaques in the right carotid bifurcation without hemodynamically significant stenosis. There is increased tortuosity of the cervical right ICA. Left carotid system: Calcified plaques in the left carotid bifurcation with mild (less than 50%) stenosis. There is increased tortuosity of the cervical left ICA. Vertebral arteries: Calcified plaque at the origin of the left vertebral artery from the aortic arch causing artifact with difficult to evaluate degree of luminal patency calcified plaque along the segment of the left vertebral artery results in mild stenosis. Remainder of the cervical segment has normal course and caliber. The right vertebral artery has normal course and caliber throughout its cervical segment. Skeleton: Negative. Other neck: Negative. Upper chest: Two nodules are seen in the right upper lobe measuring 2 mm each, 1 of them appearing calcified, unchanged when compared to CT performed 6 months ago. Review of the MIP images confirms the above findings CTA HEAD FINDINGS Anterior circulation: Calcified plaques in the bilateral carotid siphons without hemodynamically significant stenosis. 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 Posterior circulation: Normal caliber of the bilateral intracranial vertebral arteries. The basilar artery is patent without evidence of high-grade stenosis. Mild luminal irregularity along the bilateral posterior cerebral arteries without high-grade stenosis. Venous sinuses: As permitted by contrast timing, patent. Anatomic variants: None significant. Review of the MIP  images confirms the above findings IMPRESSION: 1. No intracranial large vessel occlusion. 2. 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 3. Calcified plaques in the bilateral carotid bifurcations without hemodynamically significant stenosis. 4. Calcified plaque at the origin of the left vertebral artery from the aortic arch causing mild stenosis. 5. Two 2 mm nodules in the right upper lobe, 1 of them appearing calcified, unchanged when compared to CT performed 6 months ago. 6. Aortic atherosclerosis. Aortic Atherosclerosis (ICD10-I70.0). Electronically Signed   By: Baldemar Lenis M.D.   On: 01/19/2023 14:42   CT HEAD CODE STROKE WO CONTRAST  Result Date: 01/19/2023 CLINICAL DATA:  Code stroke. Neuro deficit, acute, stroke suspected. Aphasia/dysarthria. Visual disturbance and clumsiness earlier today. EXAM: CT HEAD WITHOUT CONTRAST TECHNIQUE: Contiguous axial images were obtained from the base of the skull through the vertex without intravenous contrast. RADIATION DOSE REDUCTION: This exam  was performed according to the departmental dose-optimization program which includes automated exposure control, adjustment of the mA and/or kV according to patient size and/or use of iterative reconstruction technique. COMPARISON:  Head CT 07/27/2022 and MRI 07/28/2022 FINDINGS: Brain: There is no evidence of an acute infarct, intracranial hemorrhage, mass, midline shift, or extra-axial fluid collection. The ventricles and sulci are within normal limits for age. Patchy hypodensities in the cerebral white matter are unchanged and nonspecific but compatible with moderate chronic small vessel ischemic disease. Chronic lacunar infarcts are again noted in the basal ganglia bilaterally. Vascular: Calcified atherosclerosis at the skull base. No hyperdense vessel. Skull: No  acute fracture or suspicious osseous lesion. Sinuses/Orbits: Paranasal sinuses and mastoid air cells are clear. Bilateral cataract extraction. Other: None. ASPECTS (Alberta Stroke Program Early CT Score) - Ganglionic level infarction (caudate, lentiform nuclei, internal capsule, insula, M1-M3 cortex): 7 - Supraganglionic infarction (M4-M6 cortex): 3 Total score (0-10 with 10 being normal): 10 These results were called by telephone at the time of interpretation on 01/19/2023 at 9:17 am to Dr. Alvester Chou, who verbally acknowledged these results. IMPRESSION: 1. No evidence of acute intracranial abnormality. ASPECTS of 10. 2. Moderate chronic small vessel ischemic disease. Electronically Signed   By: Sebastian Ache M.D.   On: 01/19/2023 09:21      Assessment/Plan   Principal Problem:   Acute focal neurological deficit   ***            ***                ***                 ***               ***               ***               ***                ***               ***               ***               ***               ***              ***          ***  DVT prophylaxis: SCD's ***  Code Status: Full code*** Family Communication: none*** Disposition Plan: Per Rounding Team Consults called: none***;  Admission status: ***     I SPENT GREATER THAN 75 *** MINUTES IN CLINICAL CARE TIME/MEDICAL DECISION-MAKING IN COMPLETING THIS ADMISSION.      Chaney Born Kydan Shanholtzer DO [facility]  From 7PM - 7AM   01/19/2023, 8:45 PM   ***

## 2023-01-19 NOTE — Progress Notes (Signed)
Spoke with Dr. Renaye Rakers in [facility] ED regarding admission of this patient. In brief she is a 77 year old woman with a history of HTN, HLD, hypothyroidism. She awoke this morning with speech difficulty and blurry vision around 0400. Deficits are returning to normal but still having trouble with expressive speech. CT head completed and is unremarkable. Basic lab workup also unremarkable. Neurology consulted and recommend further workup as an inpatient, admission accepted and orders placed for [facility]. Of note patient had similar episode with admission in April of 2024, at that time imaging showed age indeterminate left basal ganglia and thalamic infarct as well as severe stenoses intracranially.

## 2023-01-19 NOTE — ED Notes (Signed)
Tele-neuro activated and tele-neuro RN speaking with pt and RN at present.

## 2023-01-19 NOTE — ED Triage Notes (Signed)
Pt arrived POV with family, caox4, ambulatory c/o double vision, abnormal speech, and that she had difficulty putting her pants on at home because "right leg feels weaker than the left." Pt reports she woke up around 0345 this morning and felt her normal self which is LKW. Pt reports sudden onset of symptoms at home by herself at approx 0415 which started as feeling like she had "double vision in both eyes," and dizziness. Pt states she called a family member who was at bedside and she felt like she wasn't talking normal, family member states she did not sound normal and still doesn't sound her normal self." Pt has mild expressive aphasia in triage with no obvious extremity weakness. Pt also states the double vision subsided but she still feels like her speech is not normal and that she feels a little dizzy. Tele-neuro activated in triage.

## 2023-01-19 NOTE — ED Provider Notes (Signed)
[place] EMERGENCY DEPARTMENT AT [facility] Provider Note   CSN: [id number] Arrival date & time: 01/19/23  3016     History  Chief Complaint  Patient presents with   Code Stroke    Suzanne Kirby is a 77 y.o. 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  Patient reports that her symptoms acutely began around 4:30 in the morning this morning.  She says she notices symptoms her cells were speech, became blurred, she was having difficulty putting on her close.  She says she went back to sleep woke up this morning and called her family members, and when she was on the phone with them she noticed that her speech was difficult and she was having hard time getting her words out.  She denies any headache.  She reports that her symptoms of all improved, including her vision, with the exception of her speech is still feels "a little off".  She is here with her family members to confirm it.  Patient takes 81 mg of aspirin, also losartan and amlodipine.  She is not on any other medications.  She has sulfa allergies.  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  Patient had been on aspirin and Plavix at that time and now weaned down to aspirin alone.  HPI     Home Medications Prior to Admission medications   Medication Sig Start Date End Date Taking? Authorizing Provider  amLODipine (NORVASC) 5 MG tablet Take 5 mg by mouth daily.    [provider]  aspirin EC 81 MG tablet Take 1 tablet (81 mg total) by mouth  daily. Swallow whole. 07/29/22   Almon Hercules, MD  atorvastatin (LIPITOR) 20 MG tablet Take 1 tablet (20 mg total) by mouth daily. 07/28/22   Almon Hercules, MD  Calcium 250 MG CAPS Take 250 mg by mouth 2 (two) times daily.    [provider]  Cholecalciferol (VITAMIN D) 50 MCG (2000 UT) tablet Take 2,000 Units by mouth 2 (two) times daily.    [provider]  cyanocobalamin (VITAMIN B12) 1000 MCG tablet Take 1,000 mcg by mouth daily.    [provider]  famotidine (PEPCID) 20 MG tablet Take 20 mg by mouth daily. 05/15/22   [provider]  ferrous sulfate 325 (65 FE) MG EC tablet Take 325 mg by mouth every other day.    [provider]  losartan (COZAAR) 50 MG tablet Take 25 mg by mouth 2 (two) times daily.    [provider]  MAGNESIUM PO Take 1 tablet by mouth 2 (two) times daily.    [provider]  Multiple Vitamins-Minerals (PRESERVISION AREDS 2) CAPS Take 1 capsule by mouth 2 (two) times daily.    [provider]  SODIUM FLUORIDE 5000 PPM 1.1 % PSTE Take 1 Application by mouth at bedtime. 04/03/22   [provider]  SYNTHROID 75 MCG tablet Take 75 mcg by mouth every morning.    [provider]  valACYclovir (VALTREX) 500 MG tablet Take 500  mg by mouth 2 (two) times daily. 05/05/22   [provider]      Allergies    Sulfa antibiotics    Review of Systems   Review of Systems  Physical Exam Updated Vital Signs BP 135/68   Pulse 75   Temp 97.7 F (36.5 C)   Resp 16   Wt 73.1 kg   SpO2 97%   BMI 30.45 kg/m  Physical Exam Constitutional:      General: She is not in acute distress. HENT:     Head: Normocephalic and atraumatic.  Eyes:     Extraocular Movements: Extraocular movements intact.     Conjunctiva/sclera: Conjunctivae normal.     Pupils: Pupils are equal, round, and reactive to light.  Cardiovascular:     Rate and Rhythm: Normal rate and regular rhythm.  Pulmonary:      Effort: Pulmonary effort is normal. No respiratory distress.  Abdominal:     General: There is no distension.     Tenderness: There is no abdominal tenderness.  Skin:    General: Skin is warm and dry.  Neurological:     Mental Status: She is alert and oriented to person, place, and time. Mental status is at baseline.     Sensory: No sensory deficit.     Motor: No weakness.     Comments: My expressive aphasia, no other acute neurological deficits noted  Psychiatric:        Mood and Affect: Mood normal.        Behavior: Behavior normal.     ED Results / Procedures / Treatments   Labs (all labs ordered are listed, but only abnormal results are displayed) Labs Reviewed  DIFFERENTIAL - Abnormal; Notable for the following components:      Result Value   Monocytes Absolute 1.1 (*)    All other components within normal limits  COMPREHENSIVE METABOLIC PANEL - Abnormal; Notable for the following components:   Glucose, Bld 104 (*)    Alkaline Phosphatase 127 (*)    All other components within normal limits  PROTIME-INR  APTT  CBC  ETHANOL  CBG MONITORING, ED    EKG EKG Interpretation Date/Time:  Friday January 19 2023 08:38:10 EDT Ventricular Rate:  79 PR Interval:  154 QRS Duration:  70 QT Interval:  394 QTC Calculation: 451 R Axis:   55  Text Interpretation: Normal sinus rhythm Low voltage QRS Cannot rule out Anterior infarct , age undetermined Abnormal ECG When compared with ECG of 27-Jul-2022 17:13, PREVIOUS ECG IS PRESENT Confirmed by Alvester Chou [phone]) on 01/19/2023 8:53:55 AM  Radiology CT ANGIO HEAD NECK W WO CM  Result Date: 01/19/2023 CLINICAL DATA:  Stroke/TIA, determine embolic source. EXAM: CT ANGIOGRAPHY HEAD AND NECK WITH AND WITHOUT CONTRAST TECHNIQUE: Multidetector CT imaging of the head and neck was performed using the standard protocol during bolus administration of intravenous contrast. Multiplanar CT image reconstructions and MIPs were obtained to  evaluate the vascular anatomy. Carotid stenosis measurements (when applicable) are obtained utilizing NASCET criteria, using the distal internal carotid diameter as the denominator. RADIATION DOSE REDUCTION: This exam was performed according to the departmental dose-optimization program which includes automated exposure control, adjustment of the mA and/or kV according to patient size and/or use of iterative reconstruction technique. CONTRAST:  75mL OMNIPAQUE IOHEXOL 350 MG/ML SOLN COMPARISON:  CT angiogram of the head and neck July 27, 2022. FINDINGS: CTA NECK FINDINGS Aortic arch: 4 vessel aortic arch with direct origin of the left vertebral artery  from the aortic arch. Imaged portion shows no evidence of aneurysm or dissection. Calcified atherosclerotic plaques in the aortic arch extending into the origin of the major neck arteries. Right carotid system: Calcified plaques in the right carotid bifurcation without hemodynamically significant stenosis. There is increased tortuosity of the cervical right ICA. Left carotid system: Calcified plaques in the left carotid bifurcation with mild (less than 50%) stenosis. There is increased tortuosity of the cervical left ICA. Vertebral arteries: Calcified plaque at the origin of the left vertebral artery from the aortic arch causing artifact with difficult to evaluate degree of luminal patency calcified plaque along the segment of the left vertebral artery results in mild stenosis. Remainder of the cervical segment has normal course and caliber. The right vertebral artery has normal course and caliber throughout its cervical segment. Skeleton: Negative. Other neck: Negative. Upper chest: Two nodules are seen in the right upper lobe measuring 2 mm each, 1 of them appearing calcified, unchanged when compared to CT performed 6 months ago. Review of the MIP images confirms the above findings CTA HEAD FINDINGS Anterior circulation: Calcified plaques in the bilateral carotid  siphons without hemodynamically significant stenosis. 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 Posterior circulation: Normal caliber of the bilateral intracranial vertebral arteries. The basilar artery is patent without evidence of high-grade stenosis. Mild luminal irregularity along the bilateral posterior cerebral arteries without high-grade stenosis. Venous sinuses: As permitted by contrast timing, patent. Anatomic variants: None significant. Review of the MIP images confirms the above findings IMPRESSION: 1. No intracranial large vessel occlusion. 2. 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 3. Calcified plaques in the bilateral carotid bifurcations without hemodynamically significant stenosis. 4. Calcified plaque at the origin of the left vertebral artery from the aortic arch causing mild stenosis. 5. Two 2 mm nodules in the right upper lobe, 1 of them appearing calcified, unchanged when compared to CT performed 6 months ago. 6. Aortic atherosclerosis. Aortic Atherosclerosis (ICD10-I70.0). Electronically Signed   By: Baldemar Lenis M.D.   On: 01/19/2023 14:42   CT HEAD CODE STROKE WO CONTRAST  Result Date: 01/19/2023 CLINICAL DATA:  Code stroke. Neuro deficit, acute, stroke suspected. Aphasia/dysarthria. Visual disturbance and clumsiness earlier today. EXAM: CT HEAD WITHOUT CONTRAST TECHNIQUE: Contiguous axial images were obtained from the base of the skull through the vertex without intravenous contrast. RADIATION DOSE REDUCTION: This exam was performed according to the departmental dose-optimization program which includes automated exposure control, adjustment of the mA and/or kV according to patient  size and/or use of iterative reconstruction technique. COMPARISON:  Head CT 07/27/2022 and MRI 07/28/2022 FINDINGS: Brain: There is no evidence of an acute infarct, intracranial hemorrhage, mass, midline shift, or extra-axial fluid collection. The ventricles and sulci are within normal limits for age. Patchy hypodensities in the cerebral white matter are unchanged and nonspecific but compatible with moderate chronic small vessel ischemic disease. Chronic lacunar infarcts are again noted in the basal ganglia bilaterally. Vascular: Calcified atherosclerosis at the skull base. No hyperdense vessel. Skull: No acute fracture or suspicious osseous lesion. Sinuses/Orbits: Paranasal sinuses and mastoid air cells are clear. Bilateral cataract extraction. Other: None. ASPECTS [facility] Stroke Program Early CT Score) - Ganglionic level infarction (caudate, lentiform nuclei, internal capsule, insula, M1-M3 cortex): 7 - Supraganglionic  infarction (M4-M6 cortex): 3 Total score (0-10 with 10 being normal): 10 These results were called by telephone at the time of interpretation on 01/19/2023 at 9:17 am to Dr. Alvester Chou, who verbally acknowledged these results. IMPRESSION: 1. No evidence of acute intracranial abnormality. ASPECTS of 10. 2. Moderate chronic small vessel ischemic disease. Electronically Signed   By: Sebastian Ache M.D.   On: 01/19/2023 09:21    Procedures .Critical Care  Performed by: Terald Sleeper, MD Authorized by: Terald Sleeper, MD   Critical care provider statement:    Critical care time (minutes):  30   Critical care time was exclusive of:  Separately billable procedures and treating other patients   Critical care was necessary to treat or prevent imminent or life-threatening deterioration of the following conditions:  CNS failure or compromise   Critical care was time spent personally by me on the following activities:  Ordering and performing treatments and interventions, ordering and  review of laboratory studies, ordering and review of radiographic studies, pulse oximetry, review of old charts, examination of patient and evaluation of patient's response to treatment   Care discussed with: admitting provider   Comments:     Neurology consult, tnk consideration, bedside reassessment     Medications Ordered in ED Medications  sodium chloride flush (NS) 0.9 % injection 3 mL (has no administration in time range)  iohexol (OMNIPAQUE) 350 MG/ML injection 75 mL (75 mLs Intravenous Contrast Given 01/19/23 1257)    ED Course/ Medical Decision Making/ A&P Clinical Course as of 01/19/23 1722  Fri Jan 19, 2023  0854 Activated code stroke, [facility] 0430 AM, NIHSS 1 for mild/moderate aphasia or dysarthria [MT]  0928 No acute findings on CT head [MT]  1255 Admitted to hospitalist for TIA/Stroke workup - CTA ordered, needs MRI at hospital.  Neurologist rec continuing aspirin, hold plavex until further testing results [MT]  1514 Blurred vision, speech difficulty improved but still some slurring. [JD]    Clinical Course User Index [JD] Laurence Spates, MD [MT] Terald Sleeper, MD                                 Medical Decision Making Amount and/or Complexity of Data Reviewed Labs: ordered. Radiology: ordered.  Risk Prescription drug management. Decision regarding hospitalization.   This patient presents to the ED with concern for difficulty with speech, blurred vision. This involves an extensive number of treatment options, and is a complaint that carries with it a high risk of complications and morbidity.  The differential diagnosis includes CVA versus TIA versus metabolic encephalopathy versus other  Co-morbidities that complicate the patient evaluation: History of stroke and cardiovascular and stroke risk factors including high blood pressure, at risk for recurring stroke  Additional history obtained from patient's family members at the bedside  External records from  outside source obtained and reviewed including hospital discharge summary and CTA and MR imaging from April 2024   I ordered and personally interpreted labs.  The pertinent results include: No emergent findings  I ordered imaging studies including CT head I independently visualized and interpreted imaging which showed no acute stroke noted I agree with the radiologist interpretation  The patient was maintained on a cardiac monitor.  I personally viewed and interpreted the cardiac monitored which showed an underlying rhythm of: Sinus  Per my interpretation the patient's ECG shows sinus rhythm with some baseline movement artifact, no acute  ischemic findings  I have reviewed the patients home medicines and have made adjustments as needed  Test Considered: doubt meningitis, SAH, no indication for emergent LP  TNK was considered but in discussion with neurologist, and given patient's minimal symptoms, was ultimately not given in ED.  I requested consultation with the neurology,  and discussed lab and imaging findings as well as pertinent plan - they recommend: medical admission for stroke/TIA evaluation  After the interventions noted above, I reevaluated the patient and found that they have: improved - overall speech improved, not back to baseline however    Dispostion:  After consideration of the diagnostic results and the patients response to treatment, I feel that the patent would benefit from admission.         Final Clinical Impression(s) / ED Diagnoses Final diagnoses:  Difficulty with speech    Rx / DC Orders ED Discharge Orders     None         Anokhi Shannon, Kermit Balo, MD 01/19/23 1724

## 2023-01-19 NOTE — ED Notes (Signed)
Kiana with cl called for transport 

## 2023-01-19 NOTE — ED Notes (Signed)
Report called to [facility] for Room 34, 3 west at [facility].

## 2023-01-19 NOTE — ED Notes (Signed)
EDP at bedside evaluating pt.  

## 2023-01-19 NOTE — Progress Notes (Signed)
Paged [facility] to notify of patient's arrival

## 2023-01-19 NOTE — Progress Notes (Signed)
0843-Code stroke activated 0845-EDP assessing patient 0846-Dr. Lindzen paged 0849-pt taken to CT scan 0854-pt back from CT scan 0856-Dr. Lindzen joined tele-neuro cart for examination

## 2023-01-20 ENCOUNTER — Encounter (HOSPITAL_COMMUNITY): Payer: Self-pay | Admitting: Family Medicine

## 2023-01-20 ENCOUNTER — Inpatient Hospital Stay (HOSPITAL_BASED_OUTPATIENT_CLINIC_OR_DEPARTMENT_OTHER): Payer: Medicare PPO

## 2023-01-20 ENCOUNTER — Other Ambulatory Visit: Payer: Self-pay | Admitting: Physician Assistant

## 2023-01-20 DIAGNOSIS — I6389 Other cerebral infarction: Secondary | ICD-10-CM | POA: Diagnosis not present

## 2023-01-20 DIAGNOSIS — I639 Cerebral infarction, unspecified: Secondary | ICD-10-CM | POA: Diagnosis present

## 2023-01-20 LAB — ECHOCARDIOGRAM COMPLETE
AR max vel: 1.74 cm2
AV Area VTI: 1.72 cm2
AV Area mean vel: 1.6 cm2
AV Mean grad: 9 mm[Hg]
AV Peak grad: 16.2 mm[Hg]
Ao pk vel: 2.01 m/s
Area-P 1/2: 2.69 cm2
Height: 61 in
MV M vel: 3.55 m/s
MV Peak grad: 50.4 mm[Hg]
MV VTI: 2.36 cm2
S' Lateral: 2.5 cm
Weight: 2578.5 [oz_av]

## 2023-01-20 LAB — LIPID PANEL
Cholesterol: 139 mg/dL (ref 0–200)
HDL: 23 mg/dL — ABNORMAL LOW (ref >40)
LDL Cholesterol: 85 mg/dL (ref 0–99)
Total CHOL/HDL Ratio: 6 {ratio}
Triglycerides: 157 mg/dL — ABNORMAL HIGH (ref <150)
VLDL: 31 mg/dL (ref 0–40)

## 2023-01-20 LAB — MAGNESIUM: Magnesium: 2.2 mg/dL (ref 1.7–2.4)

## 2023-01-20 MED ORDER — LEVOTHYROXINE SODIUM 75 MCG PO TABS
75.0000 ug | ORAL_TABLET | Freq: Every day | ORAL | Status: DC
  Administered 2023-01-20: 75 ug via ORAL
  Filled 2023-01-20: qty 1

## 2023-01-20 MED ORDER — CLOPIDOGREL BISULFATE 75 MG PO TABS
75.0000 mg | ORAL_TABLET | Freq: Every day | ORAL | Status: DC
  Administered 2023-01-20: 75 mg via ORAL
  Filled 2023-01-20: qty 1

## 2023-01-20 MED ORDER — ASPIRIN 81 MG PO TBEC: 81.0000 mg | DELAYED_RELEASE_TABLET | Freq: Every day | ORAL | 0 refills | Status: AC

## 2023-01-20 MED ORDER — STUDY - OCEANIC-STROKE - ASUNDEXIAN 50 MG OR PLACEBO TABLET (PI-SETHI)
1.0000 | ORAL_TABLET | Freq: Every day | ORAL | Status: DC
  Administered 2023-01-20: 50 mg via ORAL
  Filled 2023-01-20: qty 1

## 2023-01-20 MED ORDER — STUDY - OCEANIC-STROKE - ASUNDEXIAN 50 MG OR PLACEBO TABLET (PI-SETHI): 1.0000 | ORAL_TABLET | Freq: Every day | ORAL | Status: DC

## 2023-01-20 MED ORDER — ATORVASTATIN CALCIUM 40 MG PO TABS: 40.0000 mg | ORAL_TABLET | Freq: Every day | ORAL | 0 refills | Status: AC

## 2023-01-20 MED ORDER — CLOPIDOGREL BISULFATE 75 MG PO TABS: 75.0000 mg | ORAL_TABLET | Freq: Every day | ORAL | 0 refills | Status: AC

## 2023-01-20 MED ORDER — ATORVASTATIN CALCIUM 40 MG PO TABS: 40.0000 mg | ORAL_TABLET | Freq: Every day | ORAL | Status: DC

## 2023-01-20 NOTE — Progress Notes (Signed)
OCEANIC STROKE RESEARCH STUDY NOTE   Patient is participating in  [facility]  stroke prevention study ( standard of care antiplatelet therapy plus ASUNDEXIAN -factor 11 inhibitor versus standard of care antiplatelet therapy plus placebo ).  Patient was given study material to review and informed consent form at 11:50 AM.  Patient was advised to take as much time as she wanted to review the consent form and encouraged to ask questions which were answered.  Patient's  niece and her sister were also present at the bedside and were also encouraged to review the material and ask questions.  It was made clear to the patient that study participation is voluntary and patient will still get the same excellent standard of care irrespective of whether she chooses to participate in the study or not.  The benefits of participation in the study as well as the risk involved including but not limited to bleeding as well as alternatives to not participating in the study were clearly discussed with the patient and family who expressed understanding.  The patient was clearly informed that patient has no obligation to's stay in the study for the entire duration and is free to discontinue study medication or study participation if she is dissatisfied at any time in the future.  The research study office visit scheduled was explained to the patient and study obligations were clearly explained as well.  Patient voiced understanding and willingness to participate in the study.  The study inclusion exclusion criteria reviewed by me personally as well as study coordinator and verified that patient met all criteria.  Patient signed informed consent with study coordinator in my presence at 3 PM.  The hospital research pharmacy was notified in advance about potential patient participation and after patient randomization and patient received first dose of medication before discharge and she was discharged home with the study medication  bottle and instructed to call [facility] research office with any questions.  No study specific procedure was done prior to patient signing informed consent form.  A copy of informed consent form and patient Annette Stable of Rights signed by the patient was given to her.  Delia Heady, MD

## 2023-01-20 NOTE — Progress Notes (Addendum)
tomorrow come to the STROKE TEAM PROGRESS NOTE    HPI Ms. Torie Olivarez is a 77 y.o. female with history of CVA and HTN presenting with diplopia (vertical). Diplopia lasted about 1 hour and then resolved. Currently takes aspirin 81mg  daily.    SIGNIFICANT HOSPITAL EVENTS 10/18- transfer to [facility] for stroke work up  INTERIM HISTORY/SUBJECTIVE Increase atorvastatin to 40mg . Plavix 75mg  added.  No focal neurological weakness.'s patient states her speech is not as clear as it typically is and she is having occasional word finding difficulties.  Speech therapy recommends outpatient follow-up   OBJECTIVE  CBC    Component Value Date/Time   WBC 9.0 01/19/2023 0900   RBC 4.43 01/19/2023 0900   HGB 13.2 01/19/2023 0900   HCT 41.4 01/19/2023 0900   PLT 280 01/19/2023 0900   MCV 93.5 01/19/2023 0900   MCH 29.8 01/19/2023 0900   MCHC 31.9 01/19/2023 0900   RDW 13.4 01/19/2023 0900   LYMPHSABS 3.0 01/19/2023 0900   MONOABS 1.1 (H) 01/19/2023 0900   EOSABS 0.2 01/19/2023 0900   BASOSABS 0.0 01/19/2023 0900    BMET    Component Value Date/Time   NA 138 01/19/2023 0900   NA 141 09/12/2022 1239   K 4.1 01/19/2023 0900   CL 106 01/19/2023 0900   CO2 26 01/19/2023 0900   GLUCOSE 104 (H) 01/19/2023 0900   BUN 18 01/19/2023 0900   BUN 17 09/12/2022 1239   CREATININE 0.83 01/19/2023 0900   CALCIUM 9.8 01/19/2023 0900   EGFR 78 09/12/2022 1239   GFRNONAA >60 01/19/2023 0900    IMAGING past 24 hours MR BRAIN WO CONTRAST  Result Date: 01/20/2023 CLINICAL DATA:  Transient ischemic attack, blurry vision, difficulty speaking cyst EXAM: MRI HEAD WITHOUT CONTRAST TECHNIQUE: Multiplanar, multiecho pulse sequences of the brain and surrounding structures were obtained without intravenous contrast. COMPARISON:  None Available. FINDINGS: Brain: Multiple foci of restricted diffusion with ADC correlates in the left cerebral hemisphere, the largest of which is in the left posterior frontal  periventricular white matter (series 2, images 30 3-35), with additional more punctate foci in the left frontal cortex and white matter (series 2, images 35 and 37). No acute hemorrhage, mass, mass effect, or midline shift. No hydrocephalus or extra-axial collection. Normal pituitary and craniocervical junction. Punctate focus of hemosiderin deposition in left thalamus, likely sequela prior hypertensive microhemorrhage. Remote lacunar infarcts in the bilateral basal ganglia, thalami, and corona radiata. Dilated perivascular spaces in bilateral basal ganglia. Vascular: Normal arterial flow voids. Skull and upper cervical spine: Normal marrow signal. Sinuses/Orbits: Mild mucosal thickening in the ethmoid air cells. No acute finding in the orbits. Status post bilateral lens replacements. Other: The mastoid air cells are well aerated. IMPRESSION: Multiple acute infarcts in the left cerebral hemisphere, the largest of which is in the left posterior frontal periventricular white matter. No evidence of hemorrhage or significant mass effect. These results will be called to the ordering clinician or representative by the Radiologist Assistant, and communication documented in the PACS or [employer]. Electronically Signed   By: Wiliam Ke M.D.   On: 01/20/2023 01:44   CT ANGIO HEAD NECK W WO CM  Result Date: 01/19/2023 CLINICAL DATA:  Stroke/TIA, determine embolic source. EXAM: CT ANGIOGRAPHY HEAD AND NECK WITH AND WITHOUT CONTRAST TECHNIQUE: Multidetector CT imaging of the head and neck was performed using the standard protocol during bolus administration of intravenous contrast. Multiplanar CT image reconstructions and MIPs were obtained to evaluate the vascular anatomy. Carotid  stenosis measurements (when applicable) are obtained utilizing NASCET criteria, using the distal internal carotid diameter as the denominator. RADIATION DOSE REDUCTION: This exam was performed according to the departmental  dose-optimization program which includes automated exposure control, adjustment of the mA and/or kV according to patient size and/or use of iterative reconstruction technique. CONTRAST:  75mL OMNIPAQUE IOHEXOL 350 MG/ML SOLN COMPARISON:  CT angiogram of the head and neck July 27, 2022. FINDINGS: CTA NECK FINDINGS Aortic arch: 4 vessel aortic arch with direct origin of the left vertebral artery from the aortic arch. Imaged portion shows no evidence of aneurysm or dissection. Calcified atherosclerotic plaques in the aortic arch extending into the origin of the major neck arteries. Right carotid system: Calcified plaques in the right carotid bifurcation without hemodynamically significant stenosis. There is increased tortuosity of the cervical right ICA. Left carotid system: Calcified plaques in the left carotid bifurcation with mild (less than 50%) stenosis. There is increased tortuosity of the cervical left ICA. Vertebral arteries: Calcified plaque at the origin of the left vertebral artery from the aortic arch causing artifact with difficult to evaluate degree of luminal patency calcified plaque along the segment of the left vertebral artery results in mild stenosis. Remainder of the cervical segment has normal course and caliber. The right vertebral artery has normal course and caliber throughout its cervical segment. Skeleton: Negative. Other neck: Negative. Upper chest: Two nodules are seen in the right upper lobe measuring 2 mm each, 1 of them appearing calcified, unchanged when compared to CT performed 6 months ago. Review of the MIP images confirms the above findings CTA HEAD FINDINGS Anterior circulation: Calcified plaques in the bilateral carotid siphons without hemodynamically significant stenosis. 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 Posterior circulation:  Normal caliber of the bilateral intracranial vertebral arteries. The basilar artery is patent without evidence of high-grade stenosis. Mild luminal irregularity along the bilateral posterior cerebral arteries without high-grade stenosis. Venous sinuses: As permitted by contrast timing, patent. Anatomic variants: None significant. Review of the MIP images confirms the above findings IMPRESSION: 1. No intracranial large vessel occlusion. 2. 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 3. Calcified plaques in the bilateral carotid bifurcations without hemodynamically significant stenosis. 4. Calcified plaque at the origin of the left vertebral artery from the aortic arch causing mild stenosis. 5. Two 2 mm nodules in the right upper lobe, 1 of them appearing calcified, unchanged when compared to CT performed 6 months ago. 6. Aortic atherosclerosis. Aortic Atherosclerosis (ICD10-I70.0). Electronically Signed   By: Baldemar Lenis M.D.   On: 01/19/2023 14:42   CT HEAD CODE STROKE WO CONTRAST  Result Date: 01/19/2023 CLINICAL DATA:  Code stroke. Neuro deficit, acute, stroke suspected. Aphasia/dysarthria. Visual disturbance and clumsiness earlier today. EXAM: CT HEAD WITHOUT CONTRAST TECHNIQUE: Contiguous axial images were obtained from the base of the skull through the vertex without intravenous contrast. RADIATION DOSE REDUCTION: This exam was performed according to the departmental dose-optimization program which includes automated exposure control, adjustment of the mA and/or kV according to patient size and/or use of iterative reconstruction technique. COMPARISON:  Head CT 07/27/2022 and MRI 07/28/2022 FINDINGS: Brain: There is no evidence of an acute infarct, intracranial hemorrhage, mass, midline shift, or extra-axial fluid collection. The ventricles and  sulci  are within normal limits for age. Patchy hypodensities in the cerebral white matter are unchanged and nonspecific but compatible with moderate chronic small vessel ischemic disease. Chronic lacunar infarcts are again noted in the basal ganglia bilaterally. Vascular: Calcified atherosclerosis at the skull base. No hyperdense vessel. Skull: No acute fracture or suspicious osseous lesion. Sinuses/Orbits: Paranasal sinuses and mastoid air cells are clear. Bilateral cataract extraction. Other: None. ASPECTS (Alberta Stroke Program Early CT Score) - Ganglionic level infarction (caudate, lentiform nuclei, internal capsule, insula, M1-M3 cortex): 7 - Supraganglionic infarction (M4-M6 cortex): 3 Total score (0-10 with 10 being normal): 10 These results were called by telephone at the time of interpretation on 01/19/2023 at 9:17 am to Dr. Alvester Chou, who verbally acknowledged these results. IMPRESSION: 1. No evidence of acute intracranial abnormality. ASPECTS of 10. 2. Moderate chronic small vessel ischemic disease. Electronically Signed   By: Sebastian Ache M.D.   On: 01/19/2023 09:21    Vitals:   01/19/23 2100 01/19/23 2319 01/20/23 0401 01/20/23 0734  BP:  134/72 (!) 152/68 (!) 158/76  Pulse:  77 66 73  Resp:  18 18 18   Temp:  97.9 F (36.6 C) 98.5 F (36.9 C) 98.1 F (36.7 C)  TempSrc:  Oral Oral Oral  SpO2:  94% 96% 96%  Weight:      Height: 5\' 1"  (1.549 m)        PHYSICAL EXAM General:  Alert, well-nourished, well-developed pleasant elderly Caucasian lady in no acute distress Psych:  Mood and affect appropriate for situation CV: Regular rate and rhythm on monitor Respiratory:  Regular, unlabored respirations on room air GI: Abdomen soft and nontender   NEURO:  Mental Status: AA&Ox3, patient is able to give clear and coherent history Speech/Language: States her speech is not at baseline. Naming, repetition, fluency, and comprehension intact.  Cranial Nerves:  II: PERRL. Visual  fields full.  III, IV, VI: EOMI. Eyelids elevate symmetrically. Denies diplopia at this time V: Sensation is intact to light touch and symmetrical to face.  VII: Face is symmetrical resting and smiling VIII: hearing intact to voice. IX, X: Palate elevates symmetrically. Phonation is normal.  ZH:[id number] shrug 5/5. XII: tongue is midline without fasciculations. Motor: 5/5 strength to all muscle groups tested.  Tone: is normal and bulk is normal Sensation- Intact to light touch bilaterally. Extinction absent to light touch to DSS.   Coordination: FTN intact bilaterally, HKS: no ataxia in BLE.No drift.  Orbits left around right  Gait- deferred  NIHSS 0  premorbid modified Rankin 0  ASSESSMENT/PLAN  Acute Ischemic Infarct:  Multiple lacunar infarcts in the left cerebral hemisphere Etiology:  Small vessel disease with some underlying intracranial and extracranial atherosclerosis Code Stroke CT head No acute abnormality. ASPECTS 10.    CTA head & neck Unchanged appearance of high-grade stenosis at the A4 segment of the right anterior cerebral artery, distal left M2-M3 inferior division branch and right M2/MCA superior division branch. Calcified plaques in the bilateral carotid bifurcations without hemodynamically significant stenosis. Calcified plaque at the origin of the left vertebral artery from the aortic arch causing mild stenosis. MRI  Multiple acute infarcts in the left cerebral hemisphere, the largest of which is in the left posterior frontal periventricular white matter. 2D Echo EF is 60-65% with bilateral atria normal in size.  There is evidence of atrial level shunting detected by color-flow Doppler LDL 85 HgbA1c 5.7 VTE prophylaxis -Lovenox aspirin 81 mg daily prior to admission, now on aspirin 81 mg daily and  clopidogrel 75 mg daily for 3 weeks and then plavix alone. Therapy recommendations: Outpatient speech therapy Disposition: Home  Hx of Stroke/TIA Seen in April for a left  brain TIA, MRI at that time showed a few remote lacunar infarcts in the bilateral basal ganglia and left thalamus.  Hypertension Home meds:  amlodipine Stable Blood Pressure Goal: BP less than 220/110   Hyperlipidemia Home meds:  Atorvastatin 20mg , resumed in hospital LDL 85, goal < 70 Increase atorvastatin 40mg  High intensity statin not indicated  Continue statin at discharge  Other Stroke Risk Factors ETOH use, alcohol level <10, advised to drink no more than 1 drink(s) a day Obesity, Body mass index is 30.45 kg/m., BMI >/= 30 associated with increased stroke risk, recommend weight loss, diet and exercise as appropriate   Other Active Problems Hypothyroidism  Hospital day # 1  Patient seen and examined by NP/APP with MD. MD to update note as needed.   Elmer Picker, DNP, FNP-BC [facility] Pager: [phone]  STROKE MD NOTE :  I have personally obtained history,examined this patient, reviewed notes, independently viewed imaging studies, participated in medical decision making and plan of care.ROS completed by me personally and pertinent positives fully documented  I have made any additions or clarifications directly to the above note. Agree with note above.  Patient presented with transient episode of diplopia, word finding difficulties, slurred speech and dizziness which all appear now to have resolved but MRI scan shows multiple left hemispheric subcortical infarcts.  There is no clear brainstem infarct noted.  She does have a prior history of TIAs and lacunar strokes.  Recommend as present Plavix for 3 weeks followed by Plavix alone and aggressive risk factor modification.  Patient will also need prolonged cardiac monitoring at discharge for paroxysmal A-fib.  Patient is interested in considering participation in the  [facility] stroke prevention trial (standard of care antiplatelet therapy with or without factor XI inhibitor - Asundexian ) and was given written  information to review and decide.  It was made clear study participation voluntary the patient will get the same excellent standard of care irrespective of whether he participate in the study or not and moreover she is free to withdraw from the study at any point in the future if not satisfied.  Also spoke to the patient's niece and answered questions.  Discussed with Dr. Waymon Amato . Greater than 50% time during this 50-minute visit were spent on counseling and coordination of care about her strokes and discussion about stroke evaluation, prevention and treatment and answering questions.  Delia Heady, MD Medical Director [facility] Pager: [phone] 01/20/2023 4:15 PM   To contact Stroke Continuity provider, please refer to [email or web address]. After hours, contact General Neurology

## 2023-01-20 NOTE — Evaluation (Signed)
Occupational Therapy Evaluation Patient Details Name: Suzanne Kirby MRN: [id number] DOB: 1945-04-16 Today's Date: 01/20/2023   History of Present Illness Suzanne Kirby is a 77 y.o. female who initially presented to [facility] for double vision and word finding difficulties. MRI shows multiple acute infarcts in the left cerebral hemisphere, the largest of which is in the left posterior frontal periventricular white matter.  PMH: HLD, HTN, hypothyroidism, TIA   Clinical Impression   [name] was evaluated s/p the above admission list. She is indep and lives alone at baseline, she has family near by who can assist as needed. Upon evaluation the pt was limited by mild slurred speech and R FM coordination deficits. Overall she was mod I for ADLs and indep for mobility. Pt will benefit from continued acute OT services to address R FM HEP; pt will benefit from OP neuro OT.         If plan is discharge home, recommend the following: Assistance with cooking/housework    Functional Status Assessment  Patient has had a recent decline in their functional status and demonstrates the ability to make significant improvements in function in a reasonable and predictable amount of time.  Equipment Recommendations  None recommended by OT       Precautions / Restrictions Precautions Precautions: Fall Restrictions Weight Bearing Restrictions: No      Mobility Bed Mobility Overal bed mobility: Independent        Transfers Overall transfer level: Independent              Balance Overall balance assessment: No apparent balance deficits (not formally assessed)               ADL either performed or assessed with clinical judgement   ADL Overall ADL's : Modified independent             General ADL Comments: Pt completed all ADLs with mod I, minor RUE FM deficits do not effect her ability to open packages or complete basic ADLs. no DME needed.     Vision Baseline  Vision/History: 1 Wears glasses Vision Assessment?: No apparent visual deficits Additional Comments: Intitially presented with DV, now resolved. Vision assessment was [facility]     Perception Perception: Within Functional Limits       Praxis Praxis: [facility]       Pertinent Vitals/Pain Pain Assessment Pain Assessment: No/denies pain     Extremity/Trunk Assessment Upper Extremity Assessment Upper Extremity Assessment: RUE deficits/detail;Right hand dominant RUE Deficits / Details: minor FM deficits affecting handwriting RUE Sensation: WNL RUE Coordination: decreased fine motor   Lower Extremity Assessment Lower Extremity Assessment: Overall WFL for tasks assessed   Cervical / Trunk Assessment Cervical / Trunk Assessment: Normal   Communication Communication Communication: No apparent difficulties (slurred speech, mild)   Cognition Arousal: Alert Behavior During Therapy: WFL for tasks assessed/performed Overall Cognitive Status: Within Functional Limits for tasks assessed               General Comments  VSS on RA            Home Living Family/patient expects to be discharged to:: Private residence Living Arrangements: Alone Available Help at Discharge: Family;Available PRN/intermittently Type of Home: House Home Access: Stairs to enter [employer] of Steps: 5 Entrance Stairs-Rails: Can reach both Home Layout: Two level;Able to live on main level with bedroom/bathroom     Bathroom Shower/Tub: Producer, television/film/video: Handicapped height     Home Equipment: Shower seat - built in;Grab  bars - tub/shower   Additional Comments: neice lives near by and helps out as needed      Prior Functioning/Environment Prior Level of Function : Independent/Modified Independent;Driving             Mobility Comments: No AD, no falls ADLs Comments: mod I, drives. family assists with heavier IADLs        OT Problem List: Decreased range of motion       OT Treatment/Interventions: Self-care/ADL training;Neuromuscular education;Therapeutic activities;Patient/family education    OT Goals(Current goals can be found in the care plan section) Acute Rehab OT Goals Patient Stated Goal: home OT Goal Formulation: With patient Time For Goal Achievement: 02/03/23 Potential to Achieve Goals: Good ADL Goals Pt/caregiver will Perform Home Exercise Program: Increased ROM;Right Upper extremity;With written HEP provided  OT Frequency: Min 1X/week       AM-PAC OT "6 Clicks" Daily Activity     Outcome Measure Help from another person eating meals?: None Help from another person taking care of personal grooming?: None Help from another person toileting, which includes using toliet, bedpan, or urinal?: None Help from another person bathing (including washing, rinsing, drying)?: None Help from another person to put on and taking off regular upper body clothing?: None Help from another person to put on and taking off regular lower body clothing?: None 6 Click Score: 24   End of Session Nurse Communication: Mobility status  Activity Tolerance: Patient tolerated treatment well Patient left: in bed;with call bell/phone within reach  OT Visit Diagnosis: Hemiplegia and hemiparesis Hemiplegia - Right/Left: Right Hemiplegia - dominant/non-dominant: Dominant Hemiplegia - caused by: Cerebral infarction                Time: 1610-9604 OT Time Calculation (min): 29 min Charges:  OT General Charges $OT Visit: 1 Visit OT Evaluation $OT Eval Low Complexity: 1 Low OT Treatments $Self Care/Home Management : 8-22 mins  Derenda Mis, OTR/L Acute Rehabilitation Services Office [phone] Secure Chat Communication Preferred   Donia Pounds 01/20/2023, 9:36 AM

## 2023-01-20 NOTE — Evaluation (Addendum)
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 female who initially presented to [facility] for double vision and word finding difficulties. MRI shows multiple acute infarcts in the left cerebral hemisphere, the largest of which is in the left posterior frontal periventricular white matter.  PMH: HLD, HTN, hypothyroidism, TIA   Assessment / Plan / Recommendation Clinical Impression  Pt appears to be at her cognitive-linguistic baseline as evidenced by scoring WFL on Cognistat, with pt and niece in agreement. Although her speech is intelligible, they do believe that this remains altered from baseline. She says that she feelsl like her "tongue gets in the way" and explains that she can always think of what she wants to say, but sometimes the words don't come out as clearly as they should. There was an instance of this noted once during the evaluation. Pt is very motivated to  improve her speech and would like to pursue OP SLP upon discharge.    SLP Assessment  SLP Recommendation/Assessment: All further Speech Lanaguage Pathology  needs can be addressed in the next venue of care SLP Visit Diagnosis: Dysarthria and anarthria (R47.1)    Recommendations for follow up therapy are one component of a multi-disciplinary discharge planning process, led by the attending physician.  Recommendations may be updated based on patient status, additional functional criteria and insurance authorization.    Follow Up Recommendations  Outpatient SLP    Assistance Recommended at Discharge  Intermittent Supervision/Assistance  Functional Status Assessment Patient has had a recent decline in their functional status and demonstrates the ability to make significant improvements in function in a reasonable and predictable amount of time.  Frequency and Duration           SLP Evaluation Cognition  Overall Cognitive Status: Within Functional Limits for tasks assessed       Comprehension  Auditory Comprehension Overall Auditory Comprehension: Appears within functional limits for tasks assessed    Expression Expression Primary Mode of Expression: Verbal Verbal Expression Overall Verbal Expression: Appears within functional limits for tasks assessed   Oral / Motor  Motor Speech Overall Motor Speech: Impaired Respiration: Within functional limits Phonation: Normal Resonance: Within functional limits Articulation: Within functional limitis Intelligibility: Intelligible            Mahala Menghini., M.A. CCC-SLP Acute Rehabilitation Services Office [phone]  Secure chat preferred  01/20/2023, 3:57 PM

## 2023-01-20 NOTE — Care Management (Signed)
OP therapy referrals made to [facility] and added to AVS.

## 2023-01-20 NOTE — Progress Notes (Signed)
Pt hospitalized for a stroke.  She has seen Carolan Clines in the past, please route the monitor to her.  I will route this note to her.  Theodore Demark, PA-C 01/20/2023 4:47 PM

## 2023-01-20 NOTE — Progress Notes (Signed)
  Echocardiogram 2D Echocardiogram has been performed.  Milda Smart 01/20/2023, 12:22 PM

## 2023-01-20 NOTE — Discharge Summary (Signed)
Physician Discharge Summary  Suzanne Kirby ZOX:[id number] DOB: Sep 02, 1945  PCP: Thana Ates, MD  Admitted from: Home Discharged to: Home  Admit date: 01/19/2023 Discharge date: 01/20/2023  Recommendations for Outpatient Follow-up:    Follow-up Information     Thana Ates, MD. Schedule an appointment as soon as possible for a visit in 1 week(s).   Specialty: Internal Medicine Why: To be seen with repeat labs (CBC & CMP). Contact information: [address] [place] [address] [phone]         [facility] Follow up.   Specialty: Rehabilitation Why: they will call you for appointment for therapies Contact information: [address] [place] [address] [phone]        Maisie Fus, MD Follow up.   Specialty: Cardiology Why: The office will call you about wearing a monitor that you will get in the mail.  They will also call you about a follow-up appointment. Contact information: [address] [place] [address] [phone]                  Home Health: Outpatient OT and SLP.    Equipment/Devices: None    Discharge Condition: Improved and stable.   Code Status: Limited: Do not attempt resuscitation (DNR) -DNR-LIMITED -Do Not Intubate/DNI  Diet recommendation:  Discharge Diet Orders (From admission, onward)     Start     Ordered   01/20/23 0000  Diet - low sodium heart healthy        01/20/23 1639             Discharge Diagnoses:  Principal Problem:   Acute ischemic stroke [facility]) Active Problems:   Essential hypertension   Hyperlipidemia   Acquired hypothyroidism   Acute focal neurological deficit   Brief Summary: 77 year old female with medical history significant for essential hypertension, hyperlipidemia, acquired hypothyroidism, presented to the ED on 01/19/2023 with transient episode of diplopia, word finding  difficulties, slurred speech and dizziness.  She was in her normal state of health when she went to bed around 2200 hrs. on 01/18/2023.  She woke up around 4 AM on 10/18 with interval development of blurry vision, double vision and word finding difficulties.  She denied any other strokelike symptoms.  She reportedly had a similar episode prompting admission in April 2024 at which time imaging revealed age indeterminate infarcts in the left basal ganglia and left thalamus.  She was not aspirin 81 Mg daily prior to admission.  Kindly refer to H&P for details of hospital admission including ED course.  Assessment and plan:  Acute ischemic stroke: CT head code stroke: No evidence of acute intracranial abnormality.  Aspects of 10.  CTA head and neck: No intracranial LVO.  Unchanged appearance of high-grade stenosis at the A4 segment of the right ACA, distal left M2-M3 inferior division branch and right M2/MCA superior division branch.  Calcified plaques in the bilateral carotid bifurcations without hemodynamically significant stenosis. MRI brain: Multiple acute infarcts in the left cerebral hemisphere, the largest of which is in the left posterior frontal periventricular white matter.  No evidence of hemorrhage or significant mass effect. TTE: LVEF 60-65%.  Indeterminate LV diastolic parameters.  No aortic stenosis.  Evidence of atrial level shunting detected by color-flow Doppler.  No significant change from prior study. A1c 5.7, LDL 85 Therapies evaluated and recommend outpatient OT and SLP. Neurology consultation and stroke team follow-up appreciated.  Communicated with  Dr. Pearlean Brownie.  He recommends aspirin 81 Mg daily (POA) + Plavix 75 Mg daily x 3 weeks and then Plavix alone.  Patient also enrolling in [employer] stroke prevention trial.  As per neurology recommendations, consulted [facility] for outpatient cardiac monitor to rule out paroxysmal A-fib and they will arrange this through the office and it  will be mailed to patient. Outpatient neurology follow-up.  Essential hypertension: Allowed for permissive hypertension Continue prior home dose of amlodipine 5 Mg daily and losartan  Hyperlipidemia LDL 85, goal <70 Atorvastatin increased from 20 mg to 40 mg daily.  Hypothyroid: Continue home dose of Synthroid.  Body mass index is 30.45 kg/m./Obesity Lifestyle modifications and outpatient follow-up.   Consultations: Neurology  Procedures: None   Discharge Instructions  Discharge Instructions     Ambulatory referral to Neurology   Complete by: As directed    An appointment is requested in approximately: 2 weeks   Call MD for:   Complete by: As directed    Recurrent strokelike symptoms.   Diet - low sodium heart healthy   Complete by: As directed    Increase activity slowly   Complete by: As directed         Medication List     TAKE these medications    amLODipine 5 MG tablet Commonly known as: NORVASC Take 5 mg by mouth daily.   aspirin EC 81 MG tablet Take 1 tablet (81 mg total) by mouth daily for 20 days. Swallow whole. Start taking on: January 21, 2023   atorvastatin 40 MG tablet Commonly known as: LIPITOR Take 1 tablet (40 mg total) by mouth daily. What changed:  medication strength how much to take   Calcium 250 MG Caps Take 250 mg by mouth 2 (two) times daily.   clopidogrel 75 MG tablet Commonly known as: PLAVIX Take 1 tablet (75 mg total) by mouth daily. Start taking on: January 21, 2023   cyanocobalamin 1000 MCG tablet Commonly known as: VITAMIN B12 Take 1,000 mcg by mouth daily.   famotidine 20 MG tablet Commonly known as: PEPCID Take 20 mg by mouth daily.   ferrous sulfate 325 (65 FE) MG EC tablet Take 325 mg by mouth every other day.   ketoconazole 2 % cream Commonly known as: NIZORAL Apply 1 Application topically daily.   losartan 25 MG tablet Commonly known as: COZAAR Take 25 mg by mouth in the morning and at  bedtime.   MAGNESIUM PO Take 1 tablet by mouth 2 (two) times daily.   metroNIDAZOLE 0.75 % cream Commonly known as: METROCREAM Apply 1 Application topically daily.   OCEANIC-STROKE asundexian or placebo 50 mg tablet Take 1 tablet (50 mg total) by mouth daily. Medication will be provided by the hospital pharmacy prior to discharge. Research medication.   PreserVision AREDS 2 Caps Take 1 capsule by mouth 2 (two) times daily.   Sodium Fluoride 5000 PPM 1.1 % Pste Generic drug: Sodium Fluoride Take 1 Application by mouth at bedtime.   Synthroid 75 MCG tablet Generic drug: levothyroxine Take 75 mcg by mouth every morning.   Vitamin D 50 MCG (2000 UT) tablet Take 2,000 Units by mouth in the morning and at bedtime.       Allergies  Allergen Reactions   Sulfa Antibiotics Rash and Other (See Comments)    Mouth sores       Procedures/Studies: ECHOCARDIOGRAM COMPLETE  Result Date: 01/20/2023    ECHOCARDIOGRAM REPORT   Patient Name:   KEANNE FETTIG Date  of Exam: 01/20/2023 Medical Rec #:  [id number]         Height:       61.0 in Accession #:    [id number]        Weight:       161.2 lb Date of Birth:  [birth year]         BSA:          1.723 m Patient Age:    39 years          BP:           158/76 mmHg Patient Gender: F                 HR:           71 bpm. Exam Location:  Inpatient Procedure: 2D Echo, Cardiac Doppler and Color Doppler Indications:    TIA  History:        Patient has prior history of Echocardiogram examinations, most                 recent 09/22/2022. Stroke and TIA; Risk Factors:Hypertension and                 Dyslipidemia.  Sonographer:    Milda Smart Referring Phys: [id number] Kenetra Hildenbrand D Lakevia Perris  Sonographer Comments: Image acquisition challenging due to respiratory motion. IMPRESSIONS  1. Left ventricular ejection fraction, by estimation, is 60 to 65%. The left ventricle has normal function. The left ventricle has no regional wall motion abnormalities. There is mild  concentric left ventricular hypertrophy. Left ventricular diastolic parameters are indeterminate.  2. Right ventricular systolic function is normal. The right ventricular size is normal. Mildly increased right ventricular wall thickness.  3. The mitral valve is degenerative. Trivial mitral valve regurgitation. Severe mitral annular calcification.  4. The aortic valve was not well visualized. There is mild calcification of the aortic valve. There is mild thickening of the aortic valve. Aortic valve regurgitation is not visualized. Aortic valve sclerosis is present, with no evidence of aortic valve  stenosis.  5. The inferior vena cava is normal in size with greater than 50% respiratory variability, suggesting right atrial pressure of 3 mmHg.  6. Evidence of atrial level shunting detected by color flow Doppler. Comparison(s): No significant change from prior study. FINDINGS  Left Ventricle: Left ventricular ejection fraction, by estimation, is 60 to 65%. The left ventricle has normal function. The left ventricle has no regional wall motion abnormalities. The left ventricular internal cavity size was normal in size. There is  mild concentric left ventricular hypertrophy. Left ventricular diastolic parameters are indeterminate. Right Ventricle: The right ventricular size is normal. Mildly increased right ventricular wall thickness. Right ventricular systolic function is normal. Left Atrium: Left atrial size was normal in size. Right Atrium: Right atrial size was normal in size. Pericardium: There is no evidence of pericardial effusion. Presence of epicardial fat layer. Mitral Valve: The mitral valve is degenerative in appearance. Severe mitral annular calcification. Trivial mitral valve regurgitation. MV peak gradient, 6.2 mmHg. The mean mitral valve gradient is 2.0 mmHg. Tricuspid Valve: The tricuspid valve is normal in structure. Tricuspid valve regurgitation is trivial. No evidence of tricuspid stenosis. Aortic  Valve: The aortic valve was not well visualized. There is mild calcification of the aortic valve. There is mild thickening of the aortic valve. There is mild aortic valve annular calcification. Aortic valve regurgitation is not visualized. Aortic valve sclerosis is present, with no evidence of aortic valve stenosis. Aortic valve  mean gradient measures 9.0 mmHg. Aortic valve peak gradient measures 16.2 mmHg. Aortic valve area, by VTI measures 1.72 cm. Pulmonic Valve: The pulmonic valve was normal in structure. Pulmonic valve regurgitation is trivial. No evidence of pulmonic stenosis. Aorta: The aortic root and ascending aorta are structurally normal, with no evidence of dilitation. Venous: The inferior vena cava is normal in size with greater than 50% respiratory variability, suggesting right atrial pressure of 3 mmHg. IAS/Shunts: Evidence of atrial level shunting detected by color flow Doppler.  LEFT VENTRICLE PLAX 2D LVIDd:         3.90 cm   Diastology LVIDs:         2.50 cm   LV e' medial:    3.26 cm/s LV PW:         1.20 cm   LV E/e' medial:  24.2 LV IVS:        1.20 cm   LV e' lateral:   4.79 cm/s LVOT diam:     1.90 cm   LV E/e' lateral: 16.5 LV SV:         73 LV SV Index:   42 LVOT Area:     2.84 cm  RIGHT VENTRICLE             IVC RV Basal diam:  2.60 cm     IVC diam: 1.60 cm RV S prime:     16.90 cm/s TAPSE (M-mode): 1.8 cm LEFT ATRIUM             Index        RIGHT ATRIUM           Index LA diam:        3.70 cm 2.15 cm/m   RA Area:     10.90 cm LA Vol (A2C):   53.0 ml 30.76 ml/m  RA Volume:   20.30 ml  11.78 ml/m LA Vol (A4C):   55.2 ml 32.03 ml/m LA Biplane Vol: 55.7 ml 32.32 ml/m  AORTIC VALVE AV Area (Vmax):    1.74 cm AV Area (Vmean):   1.60 cm AV Area (VTI):     1.72 cm AV Vmax:           201.00 cm/s AV Vmean:          144.000 cm/s AV VTI:            0.424 m AV Peak Grad:      16.2 mmHg AV Mean Grad:      9.0 mmHg LVOT Vmax:         123.00 cm/s LVOT Vmean:        81.500 cm/s LVOT VTI:           0.257 m LVOT/AV VTI ratio: 0.61  AORTA Ao Root diam: 2.60 cm Ao STJ diam:  2.6 cm Ao Asc diam:  3.70 cm MITRAL VALVE MV Area (PHT): 2.69 cm     SHUNTS MV Area VTI:   2.36 cm     Systemic VTI:  0.26 m MV Peak grad:  6.2 mmHg     Systemic Diam: 1.90 cm MV Mean grad:  2.0 mmHg MV Vmax:       1.24 m/s MV Vmean:      68.4 cm/s MV Decel Time: 282 msec MR Peak grad: 50.4 mmHg MR Vmax:      355.00 cm/s MV E velocity: 79.00 cm/s MV A velocity: 109.00 cm/s MV E/A ratio:  0.72 Riley Lam MD Electronically signed by Riley Lam MD  Signature Date/Time: 01/20/2023/12:35:08 PM    Final    MR BRAIN WO CONTRAST  Result Date: 01/20/2023 CLINICAL DATA:  Transient ischemic attack, blurry vision, difficulty speaking cyst EXAM: MRI HEAD WITHOUT CONTRAST TECHNIQUE: Multiplanar, multiecho pulse sequences of the brain and surrounding structures were obtained without intravenous contrast. COMPARISON:  None Available. FINDINGS: Brain: Multiple foci of restricted diffusion with ADC correlates in the left cerebral hemisphere, the largest of which is in the left posterior frontal periventricular white matter (series 2, images 30 3-35), with additional more punctate foci in the left frontal cortex and white matter (series 2, images 35 and 37). No acute hemorrhage, mass, mass effect, or midline shift. No hydrocephalus or extra-axial collection. Normal pituitary and craniocervical junction. Punctate focus of hemosiderin deposition in left thalamus, likely sequela prior hypertensive microhemorrhage. Remote lacunar infarcts in the bilateral basal ganglia, thalami, and corona radiata. Dilated perivascular spaces in bilateral basal ganglia. Vascular: Normal arterial flow voids. Skull and upper cervical spine: Normal marrow signal. Sinuses/Orbits: Mild mucosal thickening in the ethmoid air cells. No acute finding in the orbits. Status post bilateral lens replacements. Other: The mastoid air cells are well aerated. IMPRESSION:  Multiple acute infarcts in the left cerebral hemisphere, the largest of which is in the left posterior frontal periventricular white matter. No evidence of hemorrhage or significant mass effect. These results will be called to the ordering clinician or representative by the Radiologist Assistant, and communication documented in the PACS or [employer]. Electronically Signed   By: Wiliam Ke M.D.   On: 01/20/2023 01:44   CT ANGIO HEAD NECK W WO CM  Result Date: 01/19/2023 CLINICAL DATA:  Stroke/TIA, determine embolic source. EXAM: CT ANGIOGRAPHY HEAD AND NECK WITH AND WITHOUT CONTRAST TECHNIQUE: Multidetector CT imaging of the head and neck was performed using the standard protocol during bolus administration of intravenous contrast. Multiplanar CT image reconstructions and MIPs were obtained to evaluate the vascular anatomy. Carotid stenosis measurements (when applicable) are obtained utilizing NASCET criteria, using the distal internal carotid diameter as the denominator. RADIATION DOSE REDUCTION: This exam was performed according to the departmental dose-optimization program which includes automated exposure control, adjustment of the mA and/or kV according to patient size and/or use of iterative reconstruction technique. CONTRAST:  75mL OMNIPAQUE IOHEXOL 350 MG/ML SOLN COMPARISON:  CT angiogram of the head and neck July 27, 2022. FINDINGS: CTA NECK FINDINGS Aortic arch: 4 vessel aortic arch with direct origin of the left vertebral artery from the aortic arch. Imaged portion shows no evidence of aneurysm or dissection. Calcified atherosclerotic plaques in the aortic arch extending into the origin of the major neck arteries. Right carotid system: Calcified plaques in the right carotid bifurcation without hemodynamically significant stenosis. There is increased tortuosity of the cervical right ICA. Left carotid system: Calcified plaques in the left carotid bifurcation with mild (less than 50%)  stenosis. There is increased tortuosity of the cervical left ICA. Vertebral arteries: Calcified plaque at the origin of the left vertebral artery from the aortic arch causing artifact with difficult to evaluate degree of luminal patency calcified plaque along the segment of the left vertebral artery results in mild stenosis. Remainder of the cervical segment has normal course and caliber. The right vertebral artery has normal course and caliber throughout its cervical segment. Skeleton: Negative. Other neck: Negative. Upper chest: Two nodules are seen in the right upper lobe measuring 2 mm each, 1 of them appearing calcified, unchanged when compared to CT performed 6 months ago. Review of  the MIP images confirms the above findings CTA HEAD FINDINGS Anterior circulation: Calcified plaques in the bilateral carotid siphons without hemodynamically significant stenosis. 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 Posterior circulation: Normal caliber of the bilateral intracranial vertebral arteries. The basilar artery is patent without evidence of high-grade stenosis. Mild luminal irregularity along the bilateral posterior cerebral arteries without high-grade stenosis. Venous sinuses: As permitted by contrast timing, patent. Anatomic variants: None significant. Review of the MIP images confirms the above findings IMPRESSION: 1. No intracranial large vessel occlusion. 2. 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 3. Calcified plaques in the bilateral carotid bifurcations without hemodynamically significant stenosis. 4. Calcified plaque at the origin of the left vertebral artery from the aortic arch causing mild stenosis. 5. Two 2 mm nodules  in the right upper lobe, 1 of them appearing calcified, unchanged when compared to CT performed 6 months ago. 6. Aortic atherosclerosis. Aortic Atherosclerosis (ICD10-I70.0). Electronically Signed   By: Baldemar Lenis M.D.   On: 01/19/2023 14:42   CT HEAD CODE STROKE WO CONTRAST  Result Date: 01/19/2023 CLINICAL DATA:  Code stroke. Neuro deficit, acute, stroke suspected. Aphasia/dysarthria. Visual disturbance and clumsiness earlier today. EXAM: CT HEAD WITHOUT CONTRAST TECHNIQUE: Contiguous axial images were obtained from the base of the skull through the vertex without intravenous contrast. RADIATION DOSE REDUCTION: This exam was performed according to the departmental dose-optimization program which includes automated exposure control, adjustment of the mA and/or kV according to patient size and/or use of iterative reconstruction technique. COMPARISON:  Head CT 07/27/2022 and MRI 07/28/2022 FINDINGS: Brain: There is no evidence of an acute infarct, intracranial hemorrhage, mass, midline shift, or extra-axial fluid collection. The ventricles and sulci are within normal limits for age. Patchy hypodensities in the cerebral white matter are unchanged and nonspecific but compatible with moderate chronic small vessel ischemic disease. Chronic lacunar infarcts are again noted in the basal ganglia bilaterally. Vascular: Calcified atherosclerosis at the skull base. No hyperdense vessel. Skull: No acute fracture or suspicious osseous lesion. Sinuses/Orbits: Paranasal sinuses and mastoid air cells are clear. Bilateral cataract extraction. Other: None. ASPECTS (Alberta Stroke Program Early CT Score) - Ganglionic level infarction (caudate, lentiform nuclei, internal capsule, insula, M1-M3 cortex): 7 - Supraganglionic infarction (M4-M6 cortex): 3 Total score (0-10 with 10 being normal): 10 These results were called by telephone at the time of interpretation on 01/19/2023 at 9:17 am to Dr. Alvester Chou, who  verbally acknowledged these results. IMPRESSION: 1. No evidence of acute intracranial abnormality. ASPECTS of 10. 2. Moderate chronic small vessel ischemic disease. Electronically Signed   By: Sebastian Ache M.D.   On: 01/19/2023 09:21      Subjective: Patient seen this morning.  Stated that her symptoms resolved in the ED without recurrence.  Currently without complaints.  Discharge Exam:  Vitals:   01/19/23 2319 01/20/23 0401 01/20/23 0734 01/20/23 1557  BP: 134/72 (!) 152/68 (!) 158/76 (!) 146/73  Pulse: 77 66 73 81  Resp: 18 18 18    Temp: 97.9 F (36.6 C) 98.5 F (36.9 C) 98.1 F (36.7 C) 97.9 F (36.6 C)  TempSrc: Oral Oral Oral Oral  SpO2: 94% 96% 96% 99%  Weight:      Height:        General:  Elderly female, moderately built and obese sitting comfortably at edge of bed. Cardiovascular: S1 & S2 heard, RRR, S1/S2 +. No murmurs, rubs, gallops or clicks. No JVD or pedal edema.  Telemetry personally reviewed: Sinus rhythm. Respiratory: Clear to auscultation without wheezing, rhonchi or crackles. No increased work of breathing. Abdominal:  Non distended, non tender & soft. No organomegaly or masses appreciated. Normal bowel sounds heard. CNS: Alert and oriented. No focal deficits. Extremities: no edema, no cyanosis    The results of significant diagnostics from this hospitalization (including imaging, microbiology, ancillary and laboratory) are listed below for reference.     Microbiology: No results found for this or any previous visit (from the past 240 hour(s)).   Labs: CBC: Recent Labs  Lab 01/19/23 0900  WBC 9.0  NEUTROABS 4.6  HGB 13.2  HCT 41.4  MCV 93.5  PLT 280    Basic Metabolic Panel: Recent Labs  Lab 01/19/23 0900 01/20/23 0518  NA 138  --   K 4.1  --   CL 106  --   CO2 26  --   GLUCOSE 104*  --   BUN 18  --   CREATININE 0.83  --   CALCIUM 9.8  --   MG  --  2.2    Liver Function Tests: Recent Labs  Lab 01/19/23 0900  AST 20  ALT 12   ALKPHOS 127*  BILITOT 0.7  PROT 7.5  ALBUMIN 3.9    CBG: Recent Labs  Lab 01/19/23 0842  GLUCAP 88    Hgb A1c Recent Labs    01/19/23 2305  HGBA1C 5.7*    Lipid Profile Recent Labs    01/20/23 0518  CHOL 139  HDL 23*  LDLCALC 85  TRIG 132*  CHOLHDL 6.0    Discussed in detail with patient's niece via phone, updated care and answered all questions.  Time coordinating discharge: 25 minutes  SIGNED:  Marcellus Scott, MD,  FACP, [facility], [facility], [facility] [place]     To contact the attending provider between 7A-7P or the covering provider during after hours 7P-7A, please log into the web site [email or web address] and access using universal [place] password for that web site. If you do not have the password, please call the hospital operator.

## 2023-01-20 NOTE — Care Management CC44 (Signed)
Condition Code 44 Documentation Completed  Patient Details  Name: Suzanne Kirby MRN: [id number] Date of Birth: [birth year]   Condition Code 44 given:  Yes Patient signature on Condition Code 44 notice:  Yes Documentation of 2 MD's agreement:  Yes Code 44 added to claim:  Yes    Lawerance Sabal, RN 01/20/2023, 4:56 PM

## 2023-01-20 NOTE — Progress Notes (Signed)
Physical Therapy Note  Spoke with occupational therapy after their initial evaluation. OT reports patient is functioning at a high level of independence and no physical therapy is indicated at this time. PT is signing-off. Please re-order if there is any significant change in status. Thank you for this referral.   Kathlyn Sacramento, PT, DPT [facility]  Rehabilitation Services Physical Therapist Office: [phone] Website: [email or web address]

## 2023-01-20 NOTE — Care Management Obs Status (Signed)
MEDICARE OBSERVATION STATUS NOTIFICATION   Patient Details  Name: Suzanne Kirby MRN: [id number] Date of Birth: [birth year]   Medicare Observation Status Notification Given:  Yes    Lawerance Sabal, RN 01/20/2023, 4:56 PM

## 2023-01-20 NOTE — Discharge Instructions (Signed)

## 2023-01-20 NOTE — Progress Notes (Signed)
[employer] drug given to patient and documented on the [facility]; bottle of [employer] study drug delivered to the patient and she placed it in her purse to take home.

## 2023-01-22 ENCOUNTER — Ambulatory Visit: Payer: Medicare PPO | Attending: Physician Assistant

## 2023-01-22 DIAGNOSIS — I639 Cerebral infarction, unspecified: Secondary | ICD-10-CM

## 2023-01-22 NOTE — Progress Notes (Unsigned)
Enrolled for [employer] to mail a ZIO AT Live Telemetry monitor to patients address on file.   Dr. Mary Branch to read. 

## 2023-01-23 DIAGNOSIS — G4733 Obstructive sleep apnea (adult) (pediatric): Secondary | ICD-10-CM | POA: Diagnosis not present

## 2023-01-23 DIAGNOSIS — R400 Somnolence: Secondary | ICD-10-CM | POA: Diagnosis not present

## 2023-01-25 DIAGNOSIS — I639 Cerebral infarction, unspecified: Secondary | ICD-10-CM

## 2023-01-26 DIAGNOSIS — I639 Cerebral infarction, unspecified: Secondary | ICD-10-CM | POA: Diagnosis not present

## 2023-02-01 DIAGNOSIS — F4321 Adjustment disorder with depressed mood: Secondary | ICD-10-CM | POA: Diagnosis not present

## 2023-02-05 ENCOUNTER — Encounter: Payer: Self-pay | Admitting: Speech Pathology

## 2023-02-05 ENCOUNTER — Ambulatory Visit: Payer: Medicare PPO | Attending: Internal Medicine | Admitting: Speech Pathology

## 2023-02-05 DIAGNOSIS — R471 Dysarthria and anarthria: Secondary | ICD-10-CM | POA: Insufficient documentation

## 2023-02-05 NOTE — Therapy (Unsigned)
OUTPATIENT SPEECH LANGUAGE PATHOLOGY EVALUATION   Patient Name: Suzanne Kirby MRN: [id number] DOB:1945-11-04, 77 y.o., female Today's Date: 02/05/2023  PCP: Thana Ates, MD REFERRING PROVIDER: Thana Ates, MD  END OF SESSION:   Past Medical History:  Diagnosis Date   Acquired hypothyroidism    Essential hypertension    Hyperlipidemia    Past Surgical History:  Procedure Laterality Date   TEE WITHOUT CARDIOVERSION N/A 09/22/2022   Procedure: TRANSESOPHAGEAL ECHOCARDIOGRAM;  Surgeon: Christell Constant, MD;  Location: [facility];  Service: Cardiovascular;  Laterality: N/A;   Patient Active Problem List   Diagnosis Date Noted   Acute ischemic stroke (HCC) 01/20/2023   Acute focal neurological deficit 01/19/2023   Essential hypertension 07/28/2022   Hyperlipidemia 07/28/2022   Acquired hypothyroidism 07/28/2022   Neurological symptoms 07/28/2022   Asymptomatic stenosis of intracranial artery 07/28/2022   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  Rationale for Evaluation and Treatment: Rehabilitation  SUBJECTIVE:   SUBJECTIVE STATEMENT: *** Pt accompanied by: {accompnied:27141}  PERTINENT HISTORY: Suzanne Kirby is a 77 y.o. female who initially presented to [facility] 01/19/23 for double vision and word finding difficulties. MRI shows multiple acute infarcts in the left cerebral hemisphere, the largest of which is in the left posterior frontal periventricular white matter.  PMH: HLD, HTN, hypothyroidism, TIA. SLP eval on acute care revealed cognition and languageWNL, dysarthria.    PAIN:  Are you having pain? {OPRCPAIN:27236}  FALLS: Has patient fallen in last 6 months?  {NWGNFAOZ:30865}  LIVING ENVIRONMENT: Lives with: {OPRC  lives with:25569::"lives with their family"} Lives in: {Lives in:25570}  PLOF:  Level of assistance: {HQIONGE:95284} Employment: {SLPemployment:25674}  PATIENT GOALS: ***  OBJECTIVE:  Note: Objective measures were completed at Evaluation unless otherwise noted.  DIAGNOSTIC FINDINGS: ***  COGNITION: Overall cognitive status: {cognition:24006} Areas of impairment:  {cognitiveimpairmentslp:27409} Functional deficits: ***  AUDITORY COMPREHENSION: Overall auditory comprehension: {IMPAIRED:25374} YES/NO questions: {IMPAIRED:25374} Following directions: {IMPAIRED:25374} Conversation: {SLP conversation:25430} Interfering components: {SLP interfering components:25431} Effective technique: {SLP effective technique:25432}  READING COMPREHENSION: {SLPreadingcomprehension:27140}  EXPRESSION: {SLP EXPRESSION:25433}  VERBAL EXPRESSION: Level of generative/spontaneous verbalization: {SLP level of generative/spontaneious verbalization:25435} Automatic speech: {SLP ATOMIC SPEECH:25434}  Repetition: {SLPrepetion:27212} Naming: {SLPnaming:27214} Pragmatics: {slppragmatics:27216} Comments: *** Interfering components: {SLP INTERFERING COMPONENTS:25436} Effective technique: {SLP EFFECTIVE TECHNIQUE:25437} Non-verbal means of communication: {SLP non verbal means of communication:25438}  WRITTEN EXPRESSION: Dominant hand: {RIGHT/LEFT:20294} Written expression: {slpwrittenexp:27209}  MOTOR SPEECH: Overall motor speech: {slpimpaired:27210} Level of impairment: {SLP level of impairment:25441} Respiration: {respbreathing:27195} Phonation: {SLP phonation:25439} Resonance: {SLP resonance:25440} Articulation: {SLParticulation:27218} Intelligibility: {SLP Intelligible:25442} Motor planning: {slpmotorspeecherrors:27220} Motor speech errors: {SLP motor speech errors:25443} Interfering components: {SLP Interfering components (MS):25444} Effective technique: {SLP effective technique  (MS):25445}  ORAL MOTOR EXAMINATION: Overall status: {OMESLP2:27645} Comments: ***   CLINICAL SWALLOW ASSESSMENT:   Current diet: {slpdiet:27196} Dentition: {dentition:27197} Patient directly observed with POs: {POobserved:27199} Feeding: {slp feeding:27200} Liquids provided by: {SLPliquids:27201} Oral phase signs and symptoms: {SLPoralphase:27202} Pharyngeal phase signs and symptoms: {SLPpharyngealphase:27203} Comments: ***  STANDARDIZED ASSESSMENTS: {SLPstandardizedassessment:27092}  PATIENT REPORTED OUTCOME MEASURES (PROM): {SLPPROM:27095}   TODAY'S TREATMENT:  DATE: ***   PATIENT EDUCATION: Education details: *** Person educated: {Person educated:25204} Education method: {Education Method:25205} Education comprehension: {Education Comprehension:25206}   GOALS: Goals reviewed with patient? {yes/no:20286}  SHORT TERM GOALS: Target date: ***  *** Baseline: Goal status: INITIAL  2.  *** Baseline:  Goal status: INITIAL  3.  *** Baseline:  Goal status: INITIAL  4.  *** Baseline:  Goal status: INITIAL  5.  *** Baseline:  Goal status: INITIAL  6.  *** Baseline:  Goal status: INITIAL  LONG TERM GOALS: Target date: ***  *** Baseline:  Goal status: INITIAL  2.  *** Baseline:  Goal status: INITIAL  3.  *** Baseline:  Goal status: INITIAL  4.  *** Baseline:  Goal status: INITIAL  5.  *** Baseline:  Goal status: INITIAL  6.  *** Baseline:  Goal status: INITIAL  ASSESSMENT:  CLINICAL IMPRESSION: Patient is a *** y.o. *** who was seen today for ***.   OBJECTIVE IMPAIRMENTS: include {SLPOBJIMP:27107}. These impairments are limiting patient from {SLPLIMIT:27108}. Factors affecting potential to achieve goals and functional outcome are {SLP factors:25450}. Patient will benefit from skilled SLP services to  address above impairments and improve overall function.  REHAB POTENTIAL: {rehabpotential:25112}  PLAN:  SLP FREQUENCY: {rehab frequency:25116}  SLP DURATION: {rehab duration:25117}  PLANNED INTERVENTIONS: {SLP treatment/interventions:25449}    Tandre Conly, Radene Journey, CCC-SLP 02/05/2023, 12:20 PM

## 2023-02-08 DIAGNOSIS — Z8673 Personal history of transient ischemic attack (TIA), and cerebral infarction without residual deficits: Secondary | ICD-10-CM | POA: Diagnosis not present

## 2023-02-08 DIAGNOSIS — Z79899 Other long term (current) drug therapy: Secondary | ICD-10-CM | POA: Diagnosis not present

## 2023-02-08 DIAGNOSIS — M810 Age-related osteoporosis without current pathological fracture: Secondary | ICD-10-CM | POA: Diagnosis not present

## 2023-02-12 NOTE — Therapy (Unsigned)
OUTPATIENT SPEECH LANGUAGE PATHOLOGY EVALUATION   Patient Name: Suzanne Kirby MRN: [id number] DOB:[birth year], 77 y.o., female Today's Date: 02/13/2023  PCP: Thana Ates, MD REFERRING PROVIDER: Thana Ates, MD  END OF SESSION:  End of Session - 02/13/23 0928     Visit Number 1    Number of Visits 7    Date for SLP Re-Evaluation 03/27/23    Authorization Type [employer]    SLP Start Time 0800    SLP Stop Time  0843    SLP Time Calculation (min) 43 min    Activity Tolerance Patient tolerated treatment well             Past Medical History:  Diagnosis Date   Acquired hypothyroidism    Essential hypertension    Hyperlipidemia    Past Surgical History:  Procedure Laterality Date   TEE WITHOUT CARDIOVERSION N/A 09/22/2022   Procedure: TRANSESOPHAGEAL ECHOCARDIOGRAM;  Surgeon: Christell Constant, MD;  Location: [facility];  Service: Cardiovascular;  Laterality: N/A;   Patient Active Problem List   Diagnosis Date Noted   Acute ischemic stroke (HCC) 01/20/2023   Acute focal neurological deficit 01/19/2023   Essential hypertension 07/28/2022   Hyperlipidemia 07/28/2022   Acquired hypothyroidism 07/28/2022   Neurological symptoms 07/28/2022   Asymptomatic stenosis of intracranial artery 07/28/2022   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 female who initially presented to [facility] 01/19/23 for double vision and word finding difficulties. MRI shows multiple acute  infarcts in the left cerebral hemisphere, the largest of which is in the left posterior frontal periventricular white matter.  PMH: HLD, HTN, hypothyroidism, TIA. SLP eval on acute care revealed cognition and languageWNL, dysarthria."   PAIN: Are you having pain? No  FALLS: Has patient fallen in last 6 months?  No  LIVING ENVIRONMENT: Lives with: lives alone Lives in: House/apartment  PLOF:  Level of assistance: Independent with ADLs, Independent with IADLs Employment: Retired (professor of Neurosurgeon)  PATIENT GOALS: "I want it (my speech) to feel more natural. I want to be able to talk and not feel like I have to police myself"   OBJECTIVE:  Note: Objective measures were completed at Evaluation unless otherwise noted.  COGNITION: Overall cognitive status: Within functional limits for tasks assessed  AUDITORY COMPREHENSION: Overall auditory comprehension: Appears intact  READING COMPREHENSION: Intact  EXPRESSION: verbal  VERBAL EXPRESSION: Level of generative/spontaneous verbalization: conversation  WRITTEN EXPRESSION: Dominant hand: right Written expression: Impaired: reduced legibility but improving   MOTOR SPEECH: Overall motor speech: impaired Level of impairment: Word, Phrase, Sentence, and Conversation Respiration: diaphragmatic/abdominal breathing Phonation: normal Resonance: WFL Articulation: Impaired: word, phrase, sentence, and conversation Intelligibility: Intelligibility reduced Motor planning: Impaired: groping for words and inconsistent Effective technique: slow rate, over articulate, and pacing  ORAL MOTOR EXAMINATION: Overall status: Impaired:   Lingual: Bilateral (ROM and Coordination) Comments: AMR & SMR revealed reduced oral motor coordination. Noted most difficulty for /t/ (alveloar)   PATIENT REPORTED OUTCOME MEASURES (PROM): The Communicative Participation Item Bank        Does  your condition interfere with... Pt Rating   ...talking  with people you know 1   ...communicating when you need to say something quickly 0   ...talking with people you do not know 1   ...communicating when you are out in your community 1   ...asking questions in a conversation 2   ....communicating in a small group of people 1   ...having a long conversation 0   ...giving detailed infomrmation 0   ...getting your turn in a fast moving conversation 0   ...trying to persuade a friend or family member to see a different point of view 0  3= Not at all; 2=A little; 1=Quite a bit; 0=Very much   TODAY'S TREATMENT:                                                                                                                                         02/13/23: Initiated education and instruction of dysarthria strategies (SLOP) with individualized focus on over-articulation and pausing. Generated HEP to include oral reading 10-20 mins per day and tongue twisters. Pt verbalized understanding and agreement with HEP and POC.    PATIENT EDUCATION: Education details: dysarthria strats, Automotive engineer Person educated: Patient Education method: Explanation, Demonstration, Verbal cues, and Handouts Education comprehension: verbalized understanding and needs further education   GOALS: Goals reviewed with patient? Yes  LONG TERM GOALS: Target date: 03/27/2023 (STG=LTGs)  Pt will complete daily HEP x2 sessions Baseline:  Goal status: INITIAL  2.  Pt will carryover dysarthria strategies on targeted speech tasks to be 100% intelligible with mod I  Baseline:  Goal status: INITIAL  3.  Pt will carryover dysarthria strategies during unstructured conversations x2 to 100% intelligible with mod I Baseline:  Goal status: INITIAL  4.  Pt will report improved communication effectiveness in communication scenarios x2 outside therapy with rare errors reported Baseline:  Goal status: INITIAL  5.  Pt will report improved communication via PROM by LTG Baseline:  CPIB=6 Goal status: INITIAL   ASSESSMENT:  CLINICAL IMPRESSION: Patient is a 77 y.o. F who was seen today for dysarthria s/p stroke in October 2024. Presents with min/mild dysarthria c/b occasional imprecise articulation for consonant clusters & multi-syllabic words and intermittent pausing/hesitations impacting conversational fluency. Conversation speech intelligibility rated ~95% today, with pt self-correcting errors independently. Speech changes most noticeably impacting high level and/or fast paced conversations. OME revealed reduced lingual coordination during AMR/SMR. Denied cognitive changes, aphasia, and dysphagia. Pt would benefit from skilled ST intervention to optimize communication effectiveness and QoL.   OBJECTIVE IMPAIRMENTS: include dysarthria. These impairments are limiting patient from effectively communicating at home and in community. Factors affecting potential to achieve goals and functional outcome are  n/a . Patient will benefit from skilled SLP services to address above impairments and improve overall function.  REHAB POTENTIAL: Excellent  PLAN:  SLP FREQUENCY: 1x/week  SLP DURATION: 6 weeks  PLANNED INTERVENTIONS: [phone]- Speech Eval Sound Prod, Articulate, Phonological, [phone] Treatment of speech (30 or 45 min) , Cueing hierachy, Internal/external aids, Functional tasks, Multimodal communication approach, SLP instruction and feedback, and Compensatory strategies   Gracy Racer, CCC-SLP 02/13/2023, 9:28 AM

## 2023-02-13 ENCOUNTER — Ambulatory Visit: Payer: Medicare PPO

## 2023-02-13 DIAGNOSIS — R471 Dysarthria and anarthria: Secondary | ICD-10-CM | POA: Diagnosis not present

## 2023-02-13 NOTE — Patient Instructions (Signed)
  SLOW LOUD OVER-ENNUNCIATE ** PAUSE **   SLOW AND BIG - EXAGGERATE YOUR MOUTH, MAKE EACH CONSONANT  Reading aloud 10-20 mins per day  Speech Exercises (repeat twice)  Call the cat "Buttercup" A calendar of [place], [place] Four floors to cover Yellow oil ointment Fellow lovers of felines Catastrophe in [place] Plump plumbers' plums The church's chimes chimed Telling time 'til eleven Five valve levers Keep the gate closed Go see that guy Fat cows give milk [employer] Data Gophers Fat frogs flip freely [employer] into bed Get that game to [employer] Thick thistles stick together Cinnamon aluminum linoleum Black bugs blood Lovely lemon linament Red leather, yellow leather  Big grocery buggy    Purple baby carriage [facility] [place] copper coffee pot Ripe purple cabbage Three free throws [employer] tackled  [employer] dipped the dessert  [employer] Buckle that [employer] Shirts shrink, shells shouldn't [place] [employer] Take the tackle box File the flash message Give me five flapjacks Fundamental relatives Dye the pets purple Talking [place] time after time Dark chocolate chunks Political landscape of the kingdom Actuary genius We played yo-yos yesterday   The sick sixth Sheik's sixth sheep's sick  The instinct of an extinct insect stinks  Which wrist watches are [place] wrist watches?  Imagine managing the manager at an imaginary menagerie  Not many an anemone is enamored of an enemy anemone.  If a noisy noise annoys an onion, an annoying noisy noise annoys an onion more  Knapsack strap snap.  A bragging baker baked black bread  Short soldiers should shoot sufficiently straight  She should shun the shining sun  Three thugs thrushed thoughtfully through thickness  A three-toed tree toad loved a two-toed he-toad that lived in a too-tall tree

## 2023-02-14 DIAGNOSIS — F4321 Adjustment disorder with depressed mood: Secondary | ICD-10-CM | POA: Diagnosis not present

## 2023-02-14 DIAGNOSIS — H0100A Unspecified blepharitis right eye, upper and lower eyelids: Secondary | ICD-10-CM | POA: Diagnosis not present

## 2023-02-14 DIAGNOSIS — H18593 Other hereditary corneal dystrophies, bilateral: Secondary | ICD-10-CM | POA: Diagnosis not present

## 2023-02-14 DIAGNOSIS — H04123 Dry eye syndrome of bilateral lacrimal glands: Secondary | ICD-10-CM | POA: Diagnosis not present

## 2023-02-14 DIAGNOSIS — H01004 Unspecified blepharitis left upper eyelid: Secondary | ICD-10-CM | POA: Diagnosis not present

## 2023-02-21 ENCOUNTER — Telehealth: Payer: Self-pay | Admitting: Internal Medicine

## 2023-02-21 ENCOUNTER — Ambulatory Visit: Payer: Medicare PPO

## 2023-02-21 DIAGNOSIS — R471 Dysarthria and anarthria: Secondary | ICD-10-CM

## 2023-02-21 DIAGNOSIS — L219 Seborrheic dermatitis, unspecified: Secondary | ICD-10-CM | POA: Diagnosis not present

## 2023-02-21 NOTE — Telephone Encounter (Signed)
Pt would like to switch from Dr. Wyline Mood to Dr. [place]. Please advise

## 2023-02-21 NOTE — Therapy (Signed)
OUTPATIENT SPEECH LANGUAGE PATHOLOGY TREATMENT   Patient Name: Suzanne Kirby MRN: [id number] DOB:1945-06-15, 77 y.o., female Today's Date: 02/21/2023  PCP: Thana Ates, MD REFERRING PROVIDER: Thana Ates, MD  END OF SESSION:  End of Session - 02/21/23 1228     Visit Number 2    Number of Visits 7    Date for SLP Re-Evaluation 03/27/23    Authorization Type [employer]    SLP Start Time 1230    SLP Stop Time  1315    SLP Time Calculation (min) 45 min    Activity Tolerance Patient tolerated treatment well              Past Medical History:  Diagnosis Date   Acquired hypothyroidism    Essential hypertension    Hyperlipidemia    Past Surgical History:  Procedure Laterality Date   TEE WITHOUT CARDIOVERSION N/A 09/22/2022   Procedure: TRANSESOPHAGEAL ECHOCARDIOGRAM;  Surgeon: Christell Constant, MD;  Location: [facility];  Service: Cardiovascular;  Laterality: N/A;   Patient Active Problem List   Diagnosis Date Noted   Acute ischemic stroke (HCC) 01/20/2023   Acute focal neurological deficit 01/19/2023   Essential hypertension 07/28/2022   Hyperlipidemia 07/28/2022   Acquired hypothyroidism 07/28/2022   Neurological symptoms 07/28/2022   Asymptomatic stenosis of intracranial artery 07/28/2022   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 female who initially presented to [facility] 01/19/23 for double vision and word finding difficulties. MRI shows multiple acute infarcts in the left cerebral hemisphere, the largest of which is in the left  posterior frontal periventricular white matter.  PMH: HLD, HTN, hypothyroidism, TIA. SLP eval on acute care revealed cognition and languageWNL, dysarthria."   PAIN: Are you having pain? No  FALLS: Has patient fallen in last 6 months?  No  LIVING ENVIRONMENT: Lives with: lives alone Lives in: House/apartment  PLOF:  Level of assistance: Independent with ADLs, Independent with IADLs Employment: Retired (professor of Neurosurgeon)  PATIENT GOALS: "I want it (my speech) to feel more natural. I want to be able to talk and not feel like I have to police myself"   OBJECTIVE:  Note: Objective measures were completed at Evaluation unless otherwise noted.  TODAY'S TREATMENT:                                                                                                                                         02/21/23: Completed HEP with success. Reported fluent speech while reading aloud for 20 mins. Targeted multi-syllabic words at word and  sentence level, with good articulatory precision demonstrated. Some rare reduced fluency noted during sentence generation, which SLP correlated to thought cohesion. In conversation, pt exhibited rare articulation errors x2, in which pt independently self-corrected. Reviewed dysarthria strategies of slow rate and over-articulation to aid oral motor coordination, particularly for consonant blends and during emotionally charged topics. Updated HEP.   02/13/23: Initiated education and instruction of dysarthria strategies (SLOP) with individualized focus on over-articulation and pausing. Generated HEP to include oral reading 10-20 mins per day and tongue twisters. Pt verbalized understanding and agreement with HEP and POC.    PATIENT EDUCATION: Education details: dysarthria strats, Automotive engineer Person educated: Patient Education method: Explanation, Demonstration, Verbal cues, and Handouts Education comprehension: verbalized understanding and needs further  education   GOALS: Goals reviewed with patient? Yes  LONG TERM GOALS: Target date: 03/27/2023 (STG=LTGs)  Pt will complete daily HEP x2 sessions Baseline: 02-21-23 Goal status: IN PROGRESS  2.  Pt will carryover dysarthria strategies on targeted speech tasks to be 100% intelligible with mod I  Baseline:  Goal status: MET  3.  Pt will carryover dysarthria strategies during unstructured conversations x2 to 100% intelligible with mod I Baseline:  Goal status: IN PROGRESS  4.  Pt will report improved communication effectiveness in communication scenarios x2 outside therapy with rare errors reported Baseline:  Goal status: IN PROGRESS  5.  Pt will report improved communication via PROM by LTG Baseline: CPIB=6 Goal status: IN PROGRESS   ASSESSMENT:  CLINICAL IMPRESSION: Patient is a 77 y.o. 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 Conducted ongoing education and instruction of speech strategies to maximize articulatory precision and speech intelligibility. Good carryover demonstrated. Pt would benefit from skilled ST intervention to optimize communication effectiveness and QoL.   OBJECTIVE IMPAIRMENTS: include dysarthria. These impairments are limiting patient from effectively communicating at home and in community. Factors affecting potential to achieve goals and functional outcome are  n/a . Patient will benefit from skilled SLP services to address above impairments and improve overall function.  REHAB POTENTIAL: Excellent  PLAN:  SLP FREQUENCY: 1x/week  SLP DURATION: 6 weeks  PLANNED INTERVENTIONS: [id number]- Speech Eval Sound Prod, Articulate, Phonological, [id number] Treatment of speech (30 or 45 min) , Cueing hierachy, Internal/external aids, Functional tasks, Multimodal communication approach, SLP instruction and feedback, and Compensatory strategies   Suzanne Kirby, CCC-SLP 02/21/2023, 2:50 PM

## 2023-02-22 ENCOUNTER — Ambulatory Visit: Payer: Medicare PPO | Admitting: Internal Medicine

## 2023-02-22 DIAGNOSIS — Z79899 Other long term (current) drug therapy: Secondary | ICD-10-CM | POA: Diagnosis not present

## 2023-02-22 DIAGNOSIS — M810 Age-related osteoporosis without current pathological fracture: Secondary | ICD-10-CM | POA: Diagnosis not present

## 2023-02-22 DIAGNOSIS — Z8673 Personal history of transient ischemic attack (TIA), and cerebral infarction without residual deficits: Secondary | ICD-10-CM | POA: Diagnosis not present

## 2023-02-22 DIAGNOSIS — R300 Dysuria: Secondary | ICD-10-CM | POA: Diagnosis not present

## 2023-02-22 DIAGNOSIS — E7800 Pure hypercholesterolemia, unspecified: Secondary | ICD-10-CM | POA: Diagnosis not present

## 2023-02-23 DIAGNOSIS — G4733 Obstructive sleep apnea (adult) (pediatric): Secondary | ICD-10-CM | POA: Diagnosis not present

## 2023-02-23 DIAGNOSIS — R400 Somnolence: Secondary | ICD-10-CM | POA: Diagnosis not present

## 2023-02-27 DIAGNOSIS — F4321 Adjustment disorder with depressed mood: Secondary | ICD-10-CM | POA: Diagnosis not present

## 2023-02-28 DIAGNOSIS — K219 Gastro-esophageal reflux disease without esophagitis: Secondary | ICD-10-CM | POA: Diagnosis not present

## 2023-02-28 DIAGNOSIS — Z79899 Other long term (current) drug therapy: Secondary | ICD-10-CM | POA: Diagnosis not present

## 2023-02-28 DIAGNOSIS — Z8673 Personal history of transient ischemic attack (TIA), and cerebral infarction without residual deficits: Secondary | ICD-10-CM | POA: Diagnosis not present

## 2023-02-28 DIAGNOSIS — R894 Abnormal immunological findings in specimens from other organs, systems and tissues: Secondary | ICD-10-CM | POA: Diagnosis not present

## 2023-03-05 NOTE — Therapy (Unsigned)
OUTPATIENT SPEECH LANGUAGE PATHOLOGY TREATMENT   Patient Name: Suzanne Kirby MRN: [id number] DOB:April 26, 1945, 77 y.o., female Today's Date: 03/06/2023  PCP: Thana Ates, MD REFERRING PROVIDER: Thana Ates, MD  END OF SESSION:  End of Session - 03/06/23 0848     Visit Number 3    Number of Visits 7    Date for SLP Re-Evaluation 03/27/23    Authorization Type [employer]    SLP Start Time [phone]    SLP Stop Time  0930    SLP Time Calculation (min) 40 min    Activity Tolerance Patient tolerated treatment well               Past Medical History:  Diagnosis Date   Acquired hypothyroidism    Essential hypertension    Hyperlipidemia    Past Surgical History:  Procedure Laterality Date   TEE WITHOUT CARDIOVERSION N/A 09/22/2022   Procedure: TRANSESOPHAGEAL ECHOCARDIOGRAM;  Surgeon: Christell Constant, MD;  Location: [facility];  Service: Cardiovascular;  Laterality: N/A;   Patient Active Problem List   Diagnosis Date Noted   Acute ischemic stroke (HCC) 01/20/2023   Acute focal neurological deficit 01/19/2023   Essential hypertension 07/28/2022   Hyperlipidemia 07/28/2022   Acquired hypothyroidism 07/28/2022   Neurological symptoms 07/28/2022   Asymptomatic stenosis of intracranial artery 07/28/2022   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 female who initially presented to [facility] 01/19/23 for double vision and word finding difficulties. MRI shows multiple acute infarcts in the left cerebral hemisphere, the  largest of which is in the left posterior frontal periventricular white matter.  PMH: HLD, HTN, hypothyroidism, TIA. SLP eval on acute care revealed cognition and languageWNL, dysarthria."   PAIN: Are you having pain? No  FALLS: Has patient fallen in last 6 months?  No  LIVING ENVIRONMENT: Lives with: lives alone Lives in: House/apartment  PLOF:  Level of assistance: Independent with ADLs, Independent with IADLs Employment: Retired (professor of Neurosurgeon)  PATIENT GOALS: "I want it (my speech) to feel more natural. I want to be able to talk and not feel like I have to police myself"   OBJECTIVE:  Note: Objective measures were completed at Evaluation unless otherwise noted.  TODAY'S TREATMENT:                                                                                                                                         03/06/23: Reported articulation errors x2 [employer] and criticism) in discourse  over weekend. Endorsed ability to self-correct with extra time. Pt presented with largely WNL conversational speech today, with min distortion x1. Pt able to articulate varied multi-syllabic and compound words without error in extended discourse. Reported increased confidence communicating. Updated HEP for additional dysarthria exercises. If progress continues, may not need all scheduled ST visits.   02/21/23: Completed HEP with success. Reported fluent speech while reading aloud for 20 mins. Targeted multi-syllabic words at word and sentence level, with good articulatory precision demonstrated. Some rare reduced fluency noted during sentence generation, which SLP correlated to thought cohesion. In conversation, pt exhibited rare articulation errors x2, in which pt independently self-corrected. Reviewed dysarthria strategies of slow rate and over-articulation to aid oral motor coordination, particularly for consonant blends and during emotionally charged topics. Updated HEP.    02/13/23: Initiated education and instruction of dysarthria strategies (SLOP) with individualized focus on over-articulation and pausing. Generated HEP to include oral reading 10-20 mins per day and tongue twisters. Pt verbalized understanding and agreement with HEP and POC.    PATIENT EDUCATION: Education details: dysarthria strats, Automotive engineer Person educated: Patient Education method: Explanation, Demonstration, Verbal cues, and Handouts Education comprehension: verbalized understanding and needs further education   GOALS: Goals reviewed with patient? Yes  LONG TERM GOALS: Target date: 03/27/2023 (STG=LTGs)  Pt will complete daily HEP x2 sessions Baseline: 02-21-23 Goal status: IN PROGRESS  2.  Pt will carryover dysarthria strategies on targeted speech tasks to be 100% intelligible with mod I  Baseline:  Goal status: MET  3.  Pt will carryover dysarthria strategies during unstructured conversations x2 to 100% intelligible with mod I Baseline: 03-06-23 Goal status: IN PROGRESS  4.  Pt will report improved communication effectiveness in communication scenarios x2 outside therapy with rare errors reported Baseline: 03-06-23 Goal status: IN PROGRESS  5.  Pt will report improved communication via PROM by LTG Baseline: CPIB=6 Goal status: IN PROGRESS   ASSESSMENT:  CLINICAL IMPRESSION: Patient is a 77 y.o. 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 Conducted ongoing education and instruction of speech strategies to maximize articulatory precision and speech intelligibility. Good carryover demonstrated. Pt would benefit from skilled ST intervention to optimize communication effectiveness and QoL.   OBJECTIVE IMPAIRMENTS: include dysarthria. These impairments are limiting patient from effectively communicating at home and in community. Factors affecting potential to achieve goals and functional outcome are  n/a . Patient will benefit from  skilled SLP services to address above impairments and improve overall function.  REHAB POTENTIAL: Excellent  PLAN:  SLP FREQUENCY: 1x/week  SLP DURATION: 6 weeks  PLANNED INTERVENTIONS: 92522- Speech Eval Sound Prod, Articulate, Phonological, 16109 Treatment of speech (30 or 45 min) , Cueing hierachy, Internal/external aids, Functional tasks, Multimodal communication approach, SLP instruction and feedback, and Compensatory strategies   Gracy Racer, CCC-SLP 03/06/2023, 8:52 AM

## 2023-03-06 ENCOUNTER — Ambulatory Visit: Payer: Medicare PPO | Attending: Internal Medicine

## 2023-03-06 DIAGNOSIS — R471 Dysarthria and anarthria: Secondary | ICD-10-CM | POA: Diagnosis not present

## 2023-03-06 DIAGNOSIS — L219 Seborrheic dermatitis, unspecified: Secondary | ICD-10-CM | POA: Diagnosis not present

## 2023-03-14 DIAGNOSIS — F4321 Adjustment disorder with depressed mood: Secondary | ICD-10-CM | POA: Diagnosis not present

## 2023-03-16 DIAGNOSIS — H04123 Dry eye syndrome of bilateral lacrimal glands: Secondary | ICD-10-CM | POA: Diagnosis not present

## 2023-03-16 DIAGNOSIS — H52203 Unspecified astigmatism, bilateral: Secondary | ICD-10-CM | POA: Diagnosis not present

## 2023-03-16 DIAGNOSIS — H1789 Other corneal scars and opacities: Secondary | ICD-10-CM | POA: Diagnosis not present

## 2023-03-16 DIAGNOSIS — H5213 Myopia, bilateral: Secondary | ICD-10-CM | POA: Diagnosis not present

## 2023-03-16 DIAGNOSIS — H353132 Nonexudative age-related macular degeneration, bilateral, intermediate dry stage: Secondary | ICD-10-CM | POA: Diagnosis not present

## 2023-03-16 DIAGNOSIS — H524 Presbyopia: Secondary | ICD-10-CM | POA: Diagnosis not present

## 2023-03-19 NOTE — Therapy (Signed)
OUTPATIENT SPEECH LANGUAGE PATHOLOGY TREATMENT (DISCHARGE)   Patient Name: Suzanne Kirby MRN: [id number] DOB:[birth year], 77 y.o., female Today's Date: 03/20/2023  PCP: Thana Ates, MD REFERRING PROVIDER: Thana Ates, MD  END OF SESSION:  End of Session - 03/20/23 0846     Visit Number 4    Number of Visits 7    Date for SLP Re-Evaluation 03/27/23    Authorization Type [employer]    SLP Start Time 0847    SLP Stop Time  0925    SLP Time Calculation (min) 38 min    Activity Tolerance Patient tolerated treatment well             SPEECH THERAPY DISCHARGE SUMMARY  Visits from Start of Care: 4  Current functional level related to goals / functional outcomes: Demonstrates great improvements in speech intelligibility and clarity with learned techniques. No overt communication concerns remain.    Remaining deficits: Rare articulation errors   Education / Equipment: Dysarthria strategies, HEP   Patient agrees to discharge. Patient goals were met. Patient is being discharged due to meeting the stated rehab goals.     Past Medical History:  Diagnosis Date   Acquired hypothyroidism    Essential hypertension    Hyperlipidemia    Past Surgical History:  Procedure Laterality Date   TEE WITHOUT CARDIOVERSION N/A 09/22/2022   Procedure: TRANSESOPHAGEAL ECHOCARDIOGRAM;  Surgeon: Christell Constant, MD;  Location: [facility];  Service: Cardiovascular;  Laterality: N/A;   Patient Active Problem List   Diagnosis Date Noted   Acute ischemic stroke (HCC) 01/20/2023   Acute focal neurological deficit 01/19/2023   Essential hypertension 07/28/2022   Hyperlipidemia 07/28/2022   Acquired hypothyroidism 07/28/2022   Neurological symptoms 07/28/2022   Asymptomatic stenosis of intracranial artery 07/28/2022   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 female who initially presented to [facility] 01/19/23 for double vision and word finding difficulties. MRI shows multiple acute infarcts in the left cerebral hemisphere, the largest of which is in the left posterior frontal periventricular white matter.  PMH: HLD, HTN, hypothyroidism, TIA. SLP eval on acute care revealed cognition and languageWNL, dysarthria."   PAIN: Are you having pain? No  FALLS: Has patient fallen in last 6 months?  No  LIVING ENVIRONMENT: Lives with: lives alone Lives in: House/apartment  PLOF:  Level of assistance: Independent with ADLs, Independent with IADLs Employment: Retired (professor of Neurosurgeon)  PATIENT GOALS: "I want it (my speech) to feel more natural. I want to be able to talk and not feel like I have to police myself"   OBJECTIVE:  Note: Objective measures were completed at Evaluation unless otherwise noted.  TODAY'S TREATMENT:  03/20/23: Received positive feedback from family member re: improved speech intelligibility. Pt reported rare speech error x1 while at family gather. Pt demonstrated one rare articulation error this session, which pt able to self-correct independently. Able to teach back and demonstrate trained strategies with mod I. Re-administered PROM with 20 point improvement in score noted. Pt is feeling increasingly confident and comfortable with current level of communication. Agreeable to ST discharge this date. ST goals met; discharge completed.   03/06/23: Reported articulation errors x2  [employer] and criticism) in discourse over weekend. Endorsed ability to self-correct with extra time. Pt presented with largely WNL conversational speech today, with min distortion x1. Pt able to articulate varied multi-syllabic and compound words without error in extended discourse. Reported increased confidence communicating. Updated HEP for additional dysarthria exercises. If progress continues, may not need all scheduled ST visits.   02/21/23: Completed HEP with success. Reported fluent speech while reading aloud for 20 mins. Targeted multi-syllabic words at word and sentence level, with good articulatory precision demonstrated. Some rare reduced fluency noted during sentence generation, which SLP correlated to thought cohesion. In conversation, pt exhibited rare articulation errors x2, in which pt independently self-corrected. Reviewed dysarthria strategies of slow rate and over-articulation to aid oral motor coordination, particularly for consonant blends and during emotionally charged topics. Updated HEP.   02/13/23: Initiated education and instruction of dysarthria strategies (SLOP) with individualized focus on over-articulation and pausing. Generated HEP to include oral reading 10-20 mins per day and tongue twisters. Pt verbalized understanding and agreement with HEP and POC.    PATIENT EDUCATION: Education details: dysarthria strats, Automotive engineer Person educated: Patient Education method: Explanation, Demonstration, Verbal cues, and Handouts Education comprehension: verbalized understanding and needs further education   GOALS: Goals reviewed with patient? Yes  LONG TERM GOALS: Target date: 03/27/2023 (STG=LTGs)  Pt will complete daily HEP x2 sessions Baseline: 02-21-23, 03-20-23 Goal status: MET  2.  Pt will carryover dysarthria strategies on targeted speech tasks to be 100% intelligible with mod I  Baseline:  Goal status: MET  3.  Pt will carryover dysarthria strategies  during unstructured conversations x2 to 100% intelligible with mod I Baseline: 03-06-23, 03-20-23 Goal status: MET   4.  Pt will report improved communication effectiveness in communication scenarios x2 outside therapy with rare errors reported Baseline: 03-06-23, 03-20-23 Goal status: MET  5.  Pt will report improved communication via PROM by LTG Baseline: CPIB=6; 26  Goal status: MET   ASSESSMENT:  CLINICAL IMPRESSION: Patient is a 77 y.o. F who was seen today for min dysarthria s/p stroke in October 2024. Completed education and instruction of speech strategies to maximize articulatory precision and speech intelligibility. Good carryover demonstrated. ST goals met; no further skilled ST intervention warranted.   OBJECTIVE IMPAIRMENTS: include dysarthria. These impairments are limiting patient from effectively communicating at home and in community. Factors affecting potential to achieve goals and functional outcome are  n/a . Patient will benefit from skilled SLP services to address above impairments and improve overall function.  REHAB POTENTIAL: Excellent  PLAN:  SLP FREQUENCY: 1x/week  SLP DURATION: 6 weeks  PLANNED INTERVENTIONS: 92522- Speech Eval Sound Prod, Articulate, Phonological, 81191 Treatment of speech (30 or 45 min) , Cueing hierachy, Internal/external aids, Functional tasks, Multimodal communication approach, SLP instruction and feedback, and Compensatory strategies   Gracy Racer, CCC-SLP 03/20/2023, 9:27 AM

## 2023-03-20 ENCOUNTER — Ambulatory Visit: Payer: Medicare PPO

## 2023-03-20 DIAGNOSIS — R471 Dysarthria and anarthria: Secondary | ICD-10-CM

## 2023-03-25 DIAGNOSIS — G4733 Obstructive sleep apnea (adult) (pediatric): Secondary | ICD-10-CM | POA: Diagnosis not present

## 2023-03-25 DIAGNOSIS — R400 Somnolence: Secondary | ICD-10-CM | POA: Diagnosis not present

## 2023-03-26 DIAGNOSIS — M810 Age-related osteoporosis without current pathological fracture: Secondary | ICD-10-CM | POA: Diagnosis not present

## 2023-03-26 DIAGNOSIS — E7800 Pure hypercholesterolemia, unspecified: Secondary | ICD-10-CM | POA: Diagnosis not present

## 2023-04-09 ENCOUNTER — Ambulatory Visit: Payer: Medicare PPO | Admitting: Internal Medicine

## 2023-04-11 DIAGNOSIS — F4321 Adjustment disorder with depressed mood: Secondary | ICD-10-CM | POA: Diagnosis not present

## 2023-04-16 NOTE — Progress Notes (Unsigned)
Cardiology Office Note:    Date:  04/24/2023   ID:  Suzanne Kirby, DOB 12-Jun-1945, MRN [id number]  PCP:  Thana Ates, MD   [facility] Providers Cardiologist:  None     Referring MD: Thana Ates, MD   No chief complaint on file.   History of Present Illness:    Suzanne Kirby is a 78 y.o. female seen for follow up CVA. She has a hx of HTN, HLD, stroke. Was admitted in April with TIA.  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 She was started on plavix and asa, continue statin. She had an Echo in June with ? calcified mass near the [facility] on her TTE. She had prior stress tests and TTE that were normal. She had prior cardiac monitor that was also normal.  She did have a TEE for further evaluation which showed lipomatous hypertrophy of the IAS. No significant valve pathology. Aortic atherosclerosis.   She was admitted in October 2024 with with transient episode of diplopia, word finding difficulties, slurred speech and dizziness. CTA head and neck: No intracranial LVO.  Unchanged appearance of high-grade stenosis at the A4 segment of the right ACA, distal left M2-M3 inferior division branch and right M2/MCA superior division branch.  Calcified plaques in the bilateral carotid bifurcations without hemodynamically significant stenosis. MRI brain: Multiple acute infarcts in the left cerebral hemisphere, the largest of which is in the left posterior frontal periventricular white matter.  No evidence of hemorrhage or significant mass effect. TTE: LVEF 60-65%.  Indeterminate LV diastolic parameters.  No aortic stenosis.  Evidence of atrial level shunting detected by color-flow Doppler.  No significant change from prior study. Neuro recommended DAPT with ASA and Plavix for 3 weeks followed by Plavix alone. Event monitor ordered as outpatient and was negative for Afib. She did have some short bursts of SVT- asymptomatic.   On follow up today she has  made dietary changes. Is doing some strength training. Denies any palpitations, dyspnea or chest pain. BP is controlled. She was recently started on Zetia 3 weeks ago.    Current Medications: Current Outpatient Medications on File Prior to Visit  Medication Sig Dispense Refill   amLODipine (NORVASC) 5 MG tablet Take 5 mg by mouth daily.     atorvastatin (LIPITOR) 40 MG tablet Take 1 tablet (40 mg total) by mouth daily. 90 tablet 0   Calcium 250 MG CAPS Take 250 mg by mouth 2 (two) times daily.     Cholecalciferol (VITAMIN D) 50 MCG (2000 UT) tablet Take 2,000 Units by mouth in the morning and at bedtime.     clopidogrel (PLAVIX) 75 MG tablet Take 1 tablet (75 mg total) by mouth daily. 90 tablet 0   cyanocobalamin (VITAMIN B12) 1000 MCG tablet Take 1,000 mcg by mouth daily.     ezetimibe (ZETIA) 10 MG tablet Take 10 mg by mouth daily.     famotidine (PEPCID) 20 MG tablet Take 20 mg by mouth daily.     ferrous sulfate 325 (65 FE) MG EC tablet Take 325 mg by mouth every other day.     losartan (COZAAR) 25 MG tablet Take 25 mg by mouth in the morning and at bedtime.     MAGNESIUM PO Take 1 tablet by mouth 2 (two) times daily.     Multiple Vitamins-Minerals (PRESERVISION AREDS 2) CAPS Take 1 capsule by mouth 2 (two) times daily.     SODIUM FLUORIDE  5000 PPM 1.1 % PSTE Take 1 Application by mouth at bedtime.     Study - OCEANIC-STROKE - asundexian 50 mg or placebo tablet (PI-Sethi) Take 1 tablet (50 mg total) by mouth daily. Medication will be provided by the hospital pharmacy prior to discharge. Research medication.     SYNTHROID 75 MCG tablet Take 75 mcg by mouth every morning.     No current facility-administered medications on file prior to visit.     Allergies:   Sulfa antibiotics   Social History   Socioeconomic History   Marital status: Single    Spouse name: Not on file   Number of children: Not on file   Years of education: Not on file   Highest education level: Not on file   Occupational History   Not on file  Tobacco Use   Smoking status: Never   Smokeless tobacco: Never  Vaping Use   Vaping status: Never Used  Substance and Sexual Activity   Alcohol use: Not Currently   Drug use: Never   Sexual activity: Not Currently  Other Topics Concern   Not on file  Social History Narrative   Not on file   Social Drivers of Health   Financial Resource Strain: Not on file  Food Insecurity: No Food Insecurity (01/19/2023)   Hunger Vital Sign    Worried About Running Out of Food in the Last Year: Never true    Ran Out of Food in the Last Year: Never true  Transportation Needs: No Transportation Needs (01/19/2023)   PRAPARE - Administrator, Civil Service (Medical): No    Lack of Transportation (Non-Medical): No  Physical Activity: Not on file  Stress: Not on file  Social Connections: Not on file     Family History: NA  ROS:   Please see the history of present illness.     All other systems reviewed and are negative.  EKGs/Labs/Other Studies Reviewed:    The following studies were reviewed today:  TEE 09/22/22: IMPRESSIONS     1. The interatrial septum appears to be lipomatous; this likely the mass  visualized in TTE study. Left atrial size was mildly dilated. No left  atrial/left atrial appendage thrombus was detected.   2. Left ventricular ejection fraction, by estimation, is 60 to 65%. The  left ventricle has normal function.   3. Right ventricular systolic function is normal. The right ventricular  size is normal.   4. A small pericardial effusion is present. The pericardial effusion is  circumferential. There is no evidence of cardiac tamponade.   5. The mitral valve is degenerative. Trivial mitral valve regurgitation.  No evidence of mitral stenosis.   6. Mildly calcified valve. Likely tricuspid but unable to fully evaluated  the right-left commisure. The aortic valve is abnormal. Aortic valve  regurgitation is trivial.  Aortic valve sclerosis is present, with no  evidence of aortic valve stenosis. Aortic   valve mean gradient measures 5.0 mmHg.   7. There is Moderate (Grade III) plaque involving the aortic arch.    Echo 01/20/23: IMPRESSIONS     1. Left ventricular ejection fraction, by estimation, is 60 to 65%. The  left ventricle has normal function. The left ventricle has no regional  wall motion abnormalities. There is mild concentric left ventricular  hypertrophy. Left ventricular diastolic  parameters are indeterminate.   2. Right ventricular systolic function is normal. The right ventricular  size is normal. Mildly increased right ventricular wall thickness.  3. The mitral valve is degenerative. Trivial mitral valve regurgitation.  Severe mitral annular calcification.   4. The aortic valve was not well visualized. There is mild calcification  of the aortic valve. There is mild thickening of the aortic valve. Aortic  valve regurgitation is not visualized. Aortic valve sclerosis is present,  with no evidence of aortic valve   stenosis.   5. The inferior vena cava is normal in size with greater than 50%  respiratory variability, suggesting right atrial pressure of 3 mmHg.   6. Evidence of atrial level shunting detected by color flow Doppler.   Comparison(s): No significant change from prior study.   Event monitor 02/16/23: Study Highlights  0 triggered events. No significant tachyarrhythmia or bradyarrhythmia. No atrial fibrillation or flutter.     Patch Wear Time:  14 days and 0 hours (2024-10-24T18:29:09-398 to 2024-11-07T17:29:09-498)   Patient had a min HR of 52 bpm, max HR of 194 bpm, and avg HR of 75 bpm. Predominant underlying rhythm was Sinus Rhythm. 49 Supraventricular Tachycardia runs occurred, the run with the fastest interval lasting 5 beats with a max rate of 194 bpm, the  longest lasting 11 beats with an avg rate of 128 bpm. Isolated SVEs were occasional (1.0%, 15191), SVE  Couplets were rare (<1.0%, 727), and SVE Triplets were rare (<1.0%, 169). Isolated VEs were rare (<1.0%), VE Couplets were rare (<1.0%), and no VE  Triplets were present.     EKG Interpretation Date/Time:  Tuesday April 24 2023 15:13:38 EST Ventricular Rate:  66 PR Interval:  128 QRS Duration:  82 QT Interval:  394 QTC Calculation: 413 R Axis:   21  Text Interpretation: Normal sinus rhythm Cannot rule out Anterior infarct (cited on or before 19-Jan-2023) When compared with ECG of 19-Jan-2023 08:38, Nonspecific T wave abnormality no longer evident in Lateral leads Confirmed by [place], Jorah Hua [phone]) on 04/24/2023 3:15:44 PM    Recent Labs: 07/28/2022: TSH 1.772 01/19/2023: ALT 12; BUN 18; Creatinine, Ser 0.83; Hemoglobin 13.2; Platelets 280; Potassium 4.1; Sodium 138 01/20/2023: Magnesium 2.2   Recent Lipid Panel    Component Value Date/Time   CHOL 139 01/20/2023 0518   TRIG 157 (H) 01/20/2023 0518   HDL 23 (L) 01/20/2023 0518   CHOLHDL 6.0 01/20/2023 0518   VLDL 31 01/20/2023 0518   LDLCALC 85 01/20/2023 0518  Dated 01/19/23: A1c 5.7% Dated 02/22/23: normal CMET and CBC Dated 03/26/23: cholesterol 135, triglycerides 126, HDL 29, LDL 83.   Risk Assessment/Calculations:     Physical Exam:    VS Vitals:   04/24/23 1510  BP: 120/68  Pulse: 66  SpO2: 96%     Wt Readings from Last 3 Encounters:  04/24/23 155 lb (70.3 kg)  01/19/23 161 lb 2.5 oz (73.1 kg)  09/22/22 154 lb (69.9 kg)     GEN:  Well nourished, well developed in no acute distress HEENT: Normal NECK: No JVD CARDIAC: RRR, no murmurs, rubs, gallops RESPIRATORY:  Clear to auscultation without rales, wheezing or rhonchi  ABDOMEN: Soft, non-tender, non-distended MUSCULOSKELETAL:  No edema; No deformity  SKIN: Warm and dry NEUROLOGIC:  Alert and oriented x 3 PSYCHIATRIC:  Normal affect   ASSESSMENT:    Mass adjacent to AV: Looks calcified, possible artifact. Will plan for a TEE. PLAN:    In order of  problems listed above:  S/p left MCA CVA. Secondary to intracerebral vascular disease. No cardiac cause identified. Echo and event monitor ok. Continue therapy per Neuro. HLD. Goal LDL <  55. Agree with adding Zetia since LDL 90 on lipitor. If not at goal consider Leqvio or Repatha. HTN controlled.  4.   Nonsustained brief runs of SVT. No symptoms. No additional therapy warranted.     Medication Adjustments/Labs and Tests Ordered: Current medicines are reviewed at length with the patient today.  Concerns regarding medicines are outlined above.  Orders Placed This Encounter  Procedures   EKG 12-Lead   No orders of the defined types were placed in this encounter.   Patient Instructions  Medication Instructions:  Continue same medications   Lab Work: None ordered   Testing/Procedures: None ordered   Follow-Up: At [facility], you and your health needs are our priority.  As part of our continuing mission to provide you with exceptional heart care, we have created designated Provider Care Teams.  These Care Teams include your primary Cardiologist (physician) and Advanced Practice Providers (APPs -  Physician Assistants and Nurse Practitioners) who all work together to provide you with the care you need, when you need it.  We recommend signing up for the patient portal called "MyChart".  Sign up information is provided on this After Visit Summary.  MyChart is used to connect with patients for Virtual Visits (Telemedicine).  Patients are able to view lab/test results, encounter notes, upcoming appointments, etc.  Non-urgent messages can be sent to your provider as well.   To learn more about what you can do with MyChart, go to [email or web address].    Your next appointment:  As Needed    Provider:  Dr.Lillybeth Tal          Signed, Tamarcus Condie [place], MD  04/24/2023 3:32 PM    [facility]

## 2023-04-18 DIAGNOSIS — Z79899 Other long term (current) drug therapy: Secondary | ICD-10-CM | POA: Diagnosis not present

## 2023-04-18 DIAGNOSIS — E7800 Pure hypercholesterolemia, unspecified: Secondary | ICD-10-CM | POA: Diagnosis not present

## 2023-04-23 DIAGNOSIS — G4733 Obstructive sleep apnea (adult) (pediatric): Secondary | ICD-10-CM | POA: Diagnosis not present

## 2023-04-23 DIAGNOSIS — R400 Somnolence: Secondary | ICD-10-CM | POA: Diagnosis not present

## 2023-04-24 ENCOUNTER — Encounter: Payer: Self-pay | Admitting: Cardiology

## 2023-04-24 ENCOUNTER — Ambulatory Visit: Payer: Medicare PPO | Attending: Cardiology | Admitting: Cardiology

## 2023-04-24 VITALS — BP 120/68 | HR 66 | Ht 60.0 in | Wt 155.0 lb

## 2023-04-24 DIAGNOSIS — Z8673 Personal history of transient ischemic attack (TIA), and cerebral infarction without residual deficits: Secondary | ICD-10-CM

## 2023-04-24 DIAGNOSIS — I10 Essential (primary) hypertension: Secondary | ICD-10-CM

## 2023-04-24 DIAGNOSIS — F4321 Adjustment disorder with depressed mood: Secondary | ICD-10-CM | POA: Diagnosis not present

## 2023-04-24 DIAGNOSIS — E7800 Pure hypercholesterolemia, unspecified: Secondary | ICD-10-CM

## 2023-04-24 DIAGNOSIS — I639 Cerebral infarction, unspecified: Secondary | ICD-10-CM

## 2023-04-24 NOTE — Patient Instructions (Signed)
Medication Instructions:  Continue same medications  Lab Work: None ordered   Testing/Procedures: None ordered   Follow-Up: At [facility], you and your health needs are our priority.  As part of our continuing mission to provide you with exceptional heart care, we have created designated Provider Care Teams.  These Care Teams include your primary Cardiologist (physician) and Advanced Practice Providers (APPs -  Physician Assistants and Nurse Practitioners) who all work together to provide you with the care you need, when you need it.  We recommend signing up for the patient portal called "MyChart".  Sign up information is provided on this After Visit Summary.  MyChart is used to connect with patients for Virtual Visits (Telemedicine).  Patients are able to view lab/test results, encounter notes, upcoming appointments, etc.  Non-urgent messages can be sent to your provider as well.   To learn more about what you can do with MyChart, go to [email or web address].    Your next appointment:  As Needed    Provider: Dr.Jordan   

## 2023-04-25 ENCOUNTER — Other Ambulatory Visit (HOSPITAL_COMMUNITY): Payer: Self-pay | Admitting: Pharmacist

## 2023-04-25 DIAGNOSIS — G4733 Obstructive sleep apnea (adult) (pediatric): Secondary | ICD-10-CM | POA: Diagnosis not present

## 2023-04-25 DIAGNOSIS — R400 Somnolence: Secondary | ICD-10-CM | POA: Diagnosis not present

## 2023-04-25 MED ORDER — STUDY - OCEANIC-STROKE - ASUNDEXIAN 50 MG OR PLACEBO TABLET (PI-SETHI): 1.0000 | ORAL_TABLET | Freq: Every day | ORAL | 0 refills | Status: DC

## 2023-04-30 DIAGNOSIS — R400 Somnolence: Secondary | ICD-10-CM | POA: Diagnosis not present

## 2023-04-30 DIAGNOSIS — G4733 Obstructive sleep apnea (adult) (pediatric): Secondary | ICD-10-CM | POA: Diagnosis not present

## 2023-05-03 DIAGNOSIS — M545 Low back pain, unspecified: Secondary | ICD-10-CM | POA: Diagnosis not present

## 2023-05-21 ENCOUNTER — Ambulatory Visit
Admission: RE | Admit: 2023-05-21 | Discharge: 2023-05-21 | Disposition: A | Discharge status: Home / Self Care | Payer: Medicare PPO | Source: Ambulatory Visit | Attending: Pulmonary Disease | Admitting: Pulmonary Disease

## 2023-05-21 ENCOUNTER — Other Ambulatory Visit: Payer: Self-pay | Admitting: Endocrinology

## 2023-05-21 DIAGNOSIS — M5136 Other intervertebral disc degeneration, lumbar region with discogenic back pain only: Secondary | ICD-10-CM | POA: Diagnosis not present

## 2023-05-21 DIAGNOSIS — M8588 Other specified disorders of bone density and structure, other site: Secondary | ICD-10-CM | POA: Diagnosis not present

## 2023-05-21 DIAGNOSIS — M810 Age-related osteoporosis without current pathological fracture: Secondary | ICD-10-CM

## 2023-05-31 DIAGNOSIS — J0140 Acute pansinusitis, unspecified: Secondary | ICD-10-CM | POA: Diagnosis not present

## 2023-06-07 DIAGNOSIS — F4321 Adjustment disorder with depressed mood: Secondary | ICD-10-CM | POA: Diagnosis not present

## 2023-06-08 DIAGNOSIS — Z79899 Other long term (current) drug therapy: Secondary | ICD-10-CM | POA: Diagnosis not present

## 2023-06-08 DIAGNOSIS — E7800 Pure hypercholesterolemia, unspecified: Secondary | ICD-10-CM | POA: Diagnosis not present

## 2023-06-14 DIAGNOSIS — H04123 Dry eye syndrome of bilateral lacrimal glands: Secondary | ICD-10-CM | POA: Diagnosis not present

## 2023-06-14 DIAGNOSIS — H18593 Other hereditary corneal dystrophies, bilateral: Secondary | ICD-10-CM | POA: Diagnosis not present

## 2023-06-20 DIAGNOSIS — F4321 Adjustment disorder with depressed mood: Secondary | ICD-10-CM | POA: Diagnosis not present

## 2023-07-04 ENCOUNTER — Ambulatory Visit: Attending: Internal Medicine | Admitting: Audiologist

## 2023-07-04 DIAGNOSIS — F4321 Adjustment disorder with depressed mood: Secondary | ICD-10-CM | POA: Diagnosis not present

## 2023-07-04 DIAGNOSIS — H8093 Unspecified otosclerosis, bilateral: Secondary | ICD-10-CM | POA: Diagnosis not present

## 2023-07-04 DIAGNOSIS — H906 Mixed conductive and sensorineural hearing loss, bilateral: Secondary | ICD-10-CM | POA: Insufficient documentation

## 2023-07-04 NOTE — Procedures (Signed)
  Outpatient Audiology and [facility] [address] [place], [place]  [address] [phone]  AUDIOLOGICAL  EVALUATION  NAME: Suzanne Kirby     DOB:   [birth year]      MRN: [id number]                                                                                     DATE: 07/04/2023     REFERENT: Thana Ates, MD STATUS: Outpatient DIAGNOSIS: Otosclerosis, Mixed Hearing Loss   History: Aleen was seen for an audiological evaluation due to history of otosclerosis and hearing aid use. Louna has Oticon hearing aids. She has worn aids for over ten years. She feels her hearing has changed since her aids were last programmed. She used to see providers in [place] but has moved to [place] and would like to establish care here. Years ago she had surgery on one ear for the otosclerosis, she does not remember which ear.  Chareese denies pain, pressure, or tinnitus today.  Areej no history of hazardous noise exposure.  Medical history shows recent TIA which could account for the change in hearing.    Evaluation:  Otoscopy showed a clear view of the tympanic membranes, bilaterally Tympanometry results were consistent with normal middle ear function with hypercompliance in the right ear Audiometric testing was completed using Conventional Audiometry techniques with insert earphones and supraural headphones. 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 Speech Recognition Thresholds were obtained at  70dB HL in the right ear and at 55dB HL in the left ear. Word Recognition Testing was completed at  100dB in the right ear and 95dB in the left and Lahna scored 56% in the right ear and 96% in the left ear.   Results:  The test results were reviewed with Nicholos Johns. 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 The right is worse than the left. She has much better speech understanding in the left ear. She states this has  always been the case. We do not have previous testing to compare for progression but patient feels this looks worse than previous testing.  Audiogram printed and provided to Bonita.      Recommendations: Hearing aid reprogramming to current thresholds recommended for both ears. Patient given list of local hearing aid providers.  Annual audiometric testing recommended every six months for progression.  Recommend referral to Otolaryngology due to history of otosclerosis and surgical intervention   43 minutes spent testing and counseling on results.   If you have any questions please feel free to contact me at [phone].  Ammie Ferrier Stalnaker Au.D.  Audiologist   07/04/2023  1:42 PM  Cc: Thana Ates, MD

## 2023-07-17 ENCOUNTER — Other Ambulatory Visit: Payer: Self-pay

## 2023-07-17 ENCOUNTER — Emergency Department (HOSPITAL_BASED_OUTPATIENT_CLINIC_OR_DEPARTMENT_OTHER)
Admission: EM | Admit: 2023-07-17 | Discharge: 2023-07-17 | Disposition: A | Discharge status: Home / Self Care | Attending: Emergency Medicine | Admitting: Emergency Medicine

## 2023-07-17 DIAGNOSIS — H81399 Other peripheral vertigo, unspecified ear: Secondary | ICD-10-CM | POA: Diagnosis not present

## 2023-07-17 DIAGNOSIS — R112 Nausea with vomiting, unspecified: Secondary | ICD-10-CM | POA: Insufficient documentation

## 2023-07-17 DIAGNOSIS — E039 Hypothyroidism, unspecified: Secondary | ICD-10-CM | POA: Diagnosis not present

## 2023-07-17 DIAGNOSIS — Z7901 Long term (current) use of anticoagulants: Secondary | ICD-10-CM | POA: Diagnosis not present

## 2023-07-17 DIAGNOSIS — I10 Essential (primary) hypertension: Secondary | ICD-10-CM | POA: Diagnosis not present

## 2023-07-17 DIAGNOSIS — Z79899 Other long term (current) drug therapy: Secondary | ICD-10-CM | POA: Diagnosis not present

## 2023-07-17 DIAGNOSIS — R42 Dizziness and giddiness: Secondary | ICD-10-CM | POA: Diagnosis present

## 2023-07-17 LAB — URINALYSIS, ROUTINE W REFLEX MICROSCOPIC
Bacteria, UA: NONE SEEN
Bilirubin Urine: NEGATIVE
Glucose, UA: NEGATIVE mg/dL
Hgb urine dipstick: NEGATIVE
Ketones, ur: NEGATIVE mg/dL
Leukocytes,Ua: NEGATIVE
Nitrite: NEGATIVE
Protein, ur: NEGATIVE mg/dL
Specific Gravity, Urine: 1.015 (ref 1.005–1.030)
pH: 7 (ref 5.0–8.0)

## 2023-07-17 LAB — APTT: aPTT: 36 s (ref 24–36)

## 2023-07-17 LAB — DIFFERENTIAL
Abs Immature Granulocytes: 0.03 10*3/uL (ref 0.00–0.07)
Basophils Absolute: 0 10*3/uL (ref 0.0–0.1)
Basophils Relative: 0 %
Eosinophils Absolute: 0.2 10*3/uL (ref 0.0–0.5)
Eosinophils Relative: 2 %
Immature Granulocytes: 0 %
Lymphocytes Relative: 25 %
Lymphs Abs: 2 10*3/uL (ref 0.7–4.0)
Monocytes Absolute: 0.6 10*3/uL (ref 0.1–1.0)
Monocytes Relative: 8 %
Neutro Abs: 5.3 10*3/uL (ref 1.7–7.7)
Neutrophils Relative %: 65 %

## 2023-07-17 LAB — COMPREHENSIVE METABOLIC PANEL WITH GFR
ALT: 15 U/L (ref 0–44)
AST: 21 U/L (ref 15–41)
Albumin: 4.1 g/dL (ref 3.5–5.0)
Alkaline Phosphatase: 94 U/L (ref 38–126)
Anion gap: 8 (ref 5–15)
BUN: 25 mg/dL — ABNORMAL HIGH (ref 8–23)
CO2: 25 mmol/L (ref 22–32)
Calcium: 9.3 mg/dL (ref 8.9–10.3)
Chloride: 109 mmol/L (ref 98–111)
Creatinine, Ser: 0.96 mg/dL (ref 0.44–1.00)
GFR, Estimated: >60 mL/min (ref >60)
Glucose, Bld: 139 mg/dL — ABNORMAL HIGH (ref 70–99)
Potassium: 3.8 mmol/L (ref 3.5–5.1)
Sodium: 142 mmol/L (ref 135–145)
Total Bilirubin: 0.7 mg/dL (ref 0.0–1.2)
Total Protein: 7.6 g/dL (ref 6.5–8.1)

## 2023-07-17 LAB — CBC
HCT: 40.7 % (ref 36.0–46.0)
Hemoglobin: 13.3 g/dL (ref 12.0–15.0)
MCH: 31.3 pg (ref 26.0–34.0)
MCHC: 32.7 g/dL (ref 30.0–36.0)
MCV: 95.8 fL (ref 80.0–100.0)
Platelets: 244 10*3/uL (ref 150–400)
RBC: 4.25 MIL/uL (ref 3.87–5.11)
RDW: 16 % — ABNORMAL HIGH (ref 11.5–15.5)
WBC: 8.2 10*3/uL (ref 4.0–10.5)
nRBC: 0 % (ref 0.0–0.2)

## 2023-07-17 LAB — LIPASE, BLOOD: Lipase: 36 U/L (ref 11–51)

## 2023-07-17 LAB — PROTIME-INR
INR: 1 (ref 0.8–1.2)
Prothrombin Time: 13.7 s (ref 11.4–15.2)

## 2023-07-17 LAB — ETHANOL: Alcohol, Ethyl (B): <10 mg/dL (ref <10)

## 2023-07-17 LAB — CBG MONITORING, ED: Glucose-Capillary: 130 mg/dL — ABNORMAL HIGH (ref 70–99)

## 2023-07-17 MED ORDER — MECLIZINE HCL 25 MG PO TABS: 25.0000 mg | ORAL_TABLET | Freq: Three times a day (TID) | ORAL | 0 refills | Status: AC | PRN

## 2023-07-17 MED ORDER — ONDANSETRON 4 MG PO TBDP: 4.0000 mg | ORAL_TABLET | Freq: Three times a day (TID) | ORAL | 0 refills | Status: AC | PRN

## 2023-07-17 MED ORDER — ONDANSETRON HCL 4 MG/2ML IJ SOLN
4.0000 mg | Freq: Once | INTRAMUSCULAR | Status: AC | PRN
  Administered 2023-07-17: 4 mg via INTRAVENOUS
  Filled 2023-07-17: qty 2

## 2023-07-17 NOTE — ED Provider Notes (Signed)
 [facility]ERGENCY DEPARTMENT AT [facility] Provider Note   CSN: [id number] Arrival date & time: 07/17/23  0304     History  Chief Complaint  Patient presents with   Emesis   Dizziness    Suzanne Kirby is a 78 y.o. female.  The history is provided by the patient.  Emesis Dizziness Associated symptoms: vomiting   She has history of hypertension, hyperlipidemia, hypothyroidism and comes in because of vertigo with associated nausea and vomiting.  She woke up this evening and noted that everything in the room seem to be moving.  There is associated nausea and vomiting.  She did not try to walk.  She is concerned because she has history of having had a stroke and was concerned that the symptoms might be from a stroke.  She has also had vertigo in the past but has not had nausea with it.  She denies headache, visual change, tinnitus, a hearing loss, ear pain.   Home Medications Prior to Admission medications   Medication Sig Start Date End Date Taking? Authorizing Provider  meclizine (ANTIVERT) 25 MG tablet Take 1 tablet (25 mg total) by mouth 3 (three) times daily as needed for dizziness. 07/17/23  Yes Alissa April, MD  ondansetron (ZOFRAN-ODT) 4 MG disintegrating tablet Take 1 tablet (4 mg total) by mouth every 8 (eight) hours as needed for nausea or vomiting. 07/17/23  Yes Alissa April, MD  amLODipine (NORVASC) 5 MG tablet Take 5 mg by mouth daily.    [provider]  atorvastatin (LIPITOR) 40 MG tablet Take 1 tablet (40 mg total) by mouth daily. 01/20/23   Hongalgi, Anand D, MD  Calcium 250 MG CAPS Take 250 mg by mouth 2 (two) times daily.    [provider]  Cholecalciferol (VITAMIN D) 50 MCG (2000 UT) tablet Take 2,000 Units by mouth in the morning and at bedtime.    [provider]  clopidogrel (PLAVIX) 75 MG tablet Take 1 tablet (75 mg total) by mouth daily. 01/21/23   Hongalgi, Anand D, MD  cyanocobalamin (VITAMIN B12) 1000 MCG tablet Take  1,000 mcg by mouth daily.    [provider]  ezetimibe (ZETIA) 10 MG tablet Take 10 mg by mouth daily.    [provider]  famotidine (PEPCID) 20 MG tablet Take 20 mg by mouth daily. 05/15/22   [provider]  ferrous sulfate 325 (65 FE) MG EC tablet Take 325 mg by mouth every other day.    [provider]  losartan (COZAAR) 25 MG tablet Take 25 mg by mouth in the morning and at bedtime.    [provider]  MAGNESIUM PO Take 1 tablet by mouth 2 (two) times daily.    [provider]  Multiple Vitamins-Minerals (PRESERVISION AREDS 2) CAPS Take 1 capsule by mouth 2 (two) times daily.    [provider]  SODIUM FLUORIDE 5000 PPM 1.1 % PSTE Take 1 Application by mouth at bedtime. 04/03/22   [provider]  Study Marcille Severance - asundexian 50 mg or placebo tablet (PI-Sethi) Take 1 tablet (50 mg total) by mouth daily. For Investigational Use Only 04/25/23   Sethi, Pramod S, MD  SYNTHROID 75 MCG tablet Take 75 mcg by mouth every morning.    [provider]      Allergies    Sulfa antibiotics    Review of Systems   Review of Systems  Gastrointestinal:  Positive for vomiting.  Neurological:  Positive for dizziness.  All  other systems reviewed and are negative.   Physical Exam Updated Vital Signs BP (!) 146/74   Pulse 64   Temp (!) 97.5 F (36.4 C) (Oral)   Resp 10   Ht 5' (1.524 m)   Wt 68 kg   SpO2 95%   BMI 29.29 kg/m  Physical Exam Vitals and nursing note reviewed.   78 year old female, resting comfortably and in no acute distress. Vital signs are significant for elevated blood pressure. Oxygen saturation is 97%, which is normal. Head is normocephalic and atraumatic. PERRLA, EOMI.  Neck is nontender and supple without adenopathy.  There are no carotid bruits. Back is nontender and there is no CVA tenderness. Lungs are clear without rales, wheezes, or rhonchi. Chest is nontender. Heart has regular  rate and rhythm without murmur. Abdomen is soft, flat, nontender. Extremities have no cyanosis or edema. Skin is warm and dry without rash. Neurologic: Awake and alert and oriented.  Speech is normal.  Cranial nerves are intact.  Slight rotary nystagmus is noted.  Strength is 5/5 in all 4 extremities.  There is no pronator drift.  There is no extinction on double simultaneous stimulation.  Finger-nose testing is normal.  Symptoms were not elicited by passive head movement.  ED Results / Procedures / Treatments   Labs (all labs ordered are listed, but only abnormal results are displayed) Labs Reviewed  COMPREHENSIVE METABOLIC PANEL WITH GFR - Abnormal; Notable for the following components:      Result Value   Glucose, Bld 139 (*)    BUN 25 (*)    All other components within normal limits  CBC - Abnormal; Notable for the following components:   RDW 16.0 (*)    All other components within normal limits  URINALYSIS, ROUTINE W REFLEX MICROSCOPIC - Abnormal; Notable for the following components:   APPearance HAZY (*)    All other components within normal limits  CBG MONITORING, ED - Abnormal; Notable for the following components:   Glucose-Capillary 130 (*)    All other components within normal limits  LIPASE, BLOOD  PROTIME-INR  APTT  DIFFERENTIAL  ETHANOL    EKG EKG Interpretation Date/Time:  Tuesday July 17 2023 03:25:29 EDT Ventricular Rate:  65 PR Interval:  158 QRS Duration:  74 QT Interval:  320 QTC Calculation: 332 R Axis:   67  Text Interpretation: Normal sinus rhythm Low voltage QRS Cannot rule out Anterior infarct (cited on or before 19-Jan-2023) Abnormal ECG When compared with ECG of 24-Apr-2023 15:13, Minimal criteria for Inferior infarct are no longer Present QT has shortened Confirmed by Alissa April ([id number]) on 07/17/2023 5:33:49 AM  Procedures Procedures  Cardiac monitor shows normal sinus rhythm, per my interpretation.  Medications Ordered in ED Medications   ondansetron (ZOFRAN) injection 4 mg (4 mg Intravenous Given 07/17/23 4696)    ED Course/ Medical Decision Making/ A&P                                 Medical Decision Making Amount and/or Complexity of Data Reviewed Labs: ordered.  Risk Prescription drug management.   Vertigo which seems to be peripheral vertigo based on exam.  Patient received a dose of ondansetron at triage and states she is now completely symptom-free.  She is no longer having any vertigo whatsoever.  No indication for CNS imaging.  I have reviewed her electrocardiogram, and my interpretation is low voltage and not significantly changed  from prior.  I have reviewed her laboratory tests, my interpretation is elevated random glucose level which will need to be followed as an outpatient, normal CBC.  I have reviewed her past records, and she was admitted on 01/19/2019 for stroke manifested by slurred speech, diplopia, word finding difficulties, dizziness.  Current symptoms have completely resolved.  I am ambulating the patient to make sure balance is okay before discharge.  She ambulated without difficulty.  I am discharging her with prescriptions for ondansetron oral dissolving tablet and meclizine.  Final Clinical Impression(s) / ED Diagnoses Final diagnoses:  Peripheral vertigo, unspecified laterality    Rx / DC Orders ED Discharge Orders          Ordered    meclizine (ANTIVERT) 25 MG tablet  3 times daily PRN        07/17/23 0536    ondansetron (ZOFRAN-ODT) 4 MG disintegrating tablet  Every 8 hours PRN        07/17/23 0536              Alissa April, MD 07/17/23 [phone]

## 2023-07-17 NOTE — ED Triage Notes (Signed)
 Patient reports dizziness with nausea and vomiting. Patient has had vertigo before without the nausea. Denies trauma or falling. No LOC. LKW 07/17/2023 2030. Symptoms began on 07/17/23 at 0130. Denies changes in speech, blurred vision, numbness or tingling. Takes Plavix for previous stroke.

## 2023-07-17 NOTE — ED Notes (Signed)
 Pt ambulated to bathroom without difficulty.

## 2023-07-25 ENCOUNTER — Other Ambulatory Visit (HOSPITAL_COMMUNITY): Payer: Self-pay | Admitting: Pharmacist

## 2023-07-25 MED ORDER — STUDY - OCEANIC-STROKE - ASUNDEXIAN 50 MG OR PLACEBO TABLET (PI-SETHI): 1.0000 | ORAL_TABLET | Freq: Every day | ORAL | 0 refills | Status: AC

## 2023-07-26 DIAGNOSIS — F4321 Adjustment disorder with depressed mood: Secondary | ICD-10-CM | POA: Diagnosis not present

## 2023-08-08 DIAGNOSIS — E7800 Pure hypercholesterolemia, unspecified: Secondary | ICD-10-CM | POA: Diagnosis not present

## 2023-08-08 DIAGNOSIS — F4321 Adjustment disorder with depressed mood: Secondary | ICD-10-CM | POA: Diagnosis not present

## 2023-08-08 DIAGNOSIS — R42 Dizziness and giddiness: Secondary | ICD-10-CM | POA: Diagnosis not present

## 2023-08-08 DIAGNOSIS — R7303 Prediabetes: Secondary | ICD-10-CM | POA: Diagnosis not present

## 2023-08-08 DIAGNOSIS — I10 Essential (primary) hypertension: Secondary | ICD-10-CM | POA: Diagnosis not present

## 2023-08-08 DIAGNOSIS — M810 Age-related osteoporosis without current pathological fracture: Secondary | ICD-10-CM | POA: Diagnosis not present

## 2023-08-13 DIAGNOSIS — E7800 Pure hypercholesterolemia, unspecified: Secondary | ICD-10-CM | POA: Diagnosis not present

## 2023-08-15 DIAGNOSIS — H0100A Unspecified blepharitis right eye, upper and lower eyelids: Secondary | ICD-10-CM | POA: Diagnosis not present

## 2023-08-29 DIAGNOSIS — F4321 Adjustment disorder with depressed mood: Secondary | ICD-10-CM | POA: Diagnosis not present

## 2023-09-03 DIAGNOSIS — H01111 Allergic dermatitis of right upper eyelid: Secondary | ICD-10-CM | POA: Diagnosis not present

## 2023-09-12 DIAGNOSIS — F4321 Adjustment disorder with depressed mood: Secondary | ICD-10-CM | POA: Diagnosis not present

## 2023-09-25 DIAGNOSIS — G4733 Obstructive sleep apnea (adult) (pediatric): Secondary | ICD-10-CM | POA: Diagnosis not present

## 2023-09-25 DIAGNOSIS — Z79899 Other long term (current) drug therapy: Secondary | ICD-10-CM | POA: Diagnosis not present

## 2023-09-25 DIAGNOSIS — F4321 Adjustment disorder with depressed mood: Secondary | ICD-10-CM | POA: Diagnosis not present

## 2023-09-25 DIAGNOSIS — G4734 Idiopathic sleep related nonobstructive alveolar hypoventilation: Secondary | ICD-10-CM | POA: Diagnosis not present

## 2023-10-23 DIAGNOSIS — R300 Dysuria: Secondary | ICD-10-CM | POA: Diagnosis not present

## 2023-11-07 DIAGNOSIS — Z1231 Encounter for screening mammogram for malignant neoplasm of breast: Secondary | ICD-10-CM | POA: Diagnosis not present

## 2023-11-14 DIAGNOSIS — F4321 Adjustment disorder with depressed mood: Secondary | ICD-10-CM | POA: Diagnosis not present

## 2023-11-23 DIAGNOSIS — G4733 Obstructive sleep apnea (adult) (pediatric): Secondary | ICD-10-CM | POA: Diagnosis not present

## 2023-11-25 IMAGING — US US PELVIS COMPLETE WITH TRANSVAGINAL
1 series · 14 of 25 positions shown · non-contrast
Comparison: None

CLINICAL DATA: LEFT lower quadrant abdominal pain for 2 weeks,
postmenopausal



[Series 1: us pelvis complete with transvaginal · 0.21mm/px · 14 of 45 slices shown]
[im 1/45]
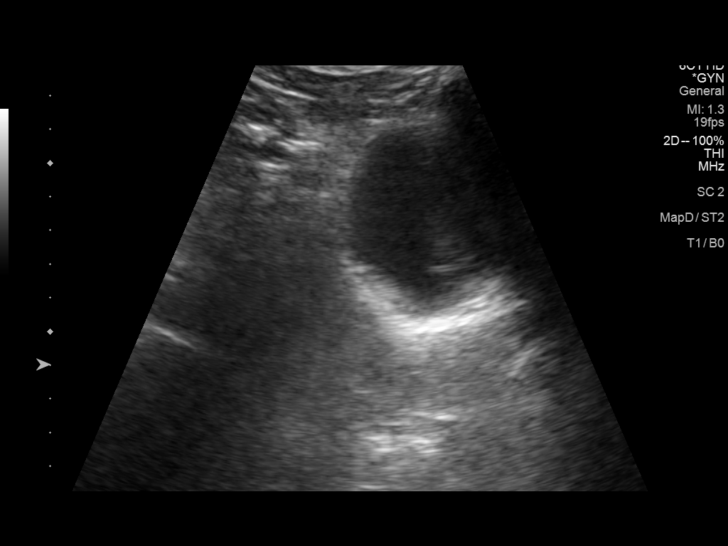
[im 4/45]
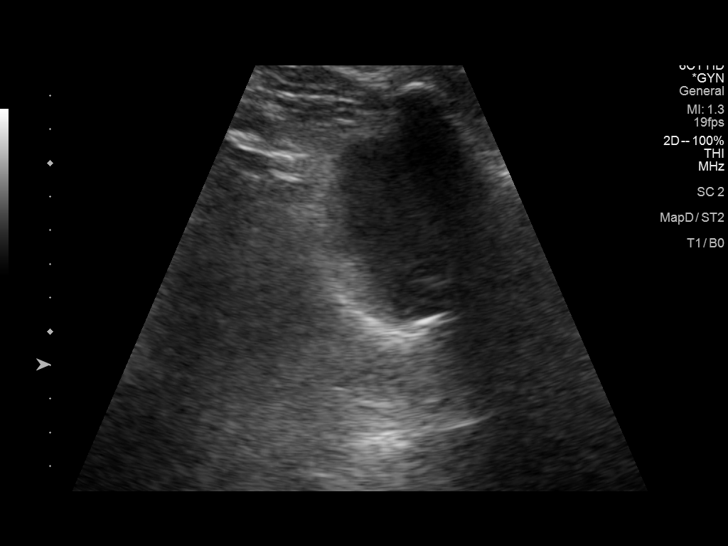
[im 8/45]
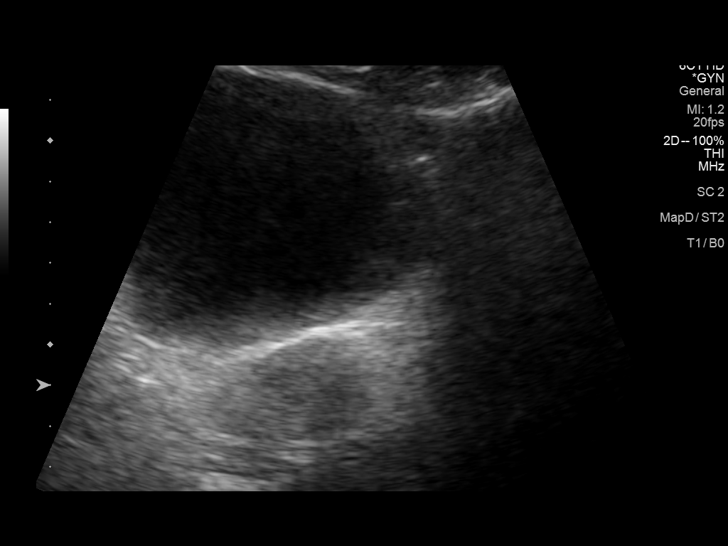
[im 12/45]
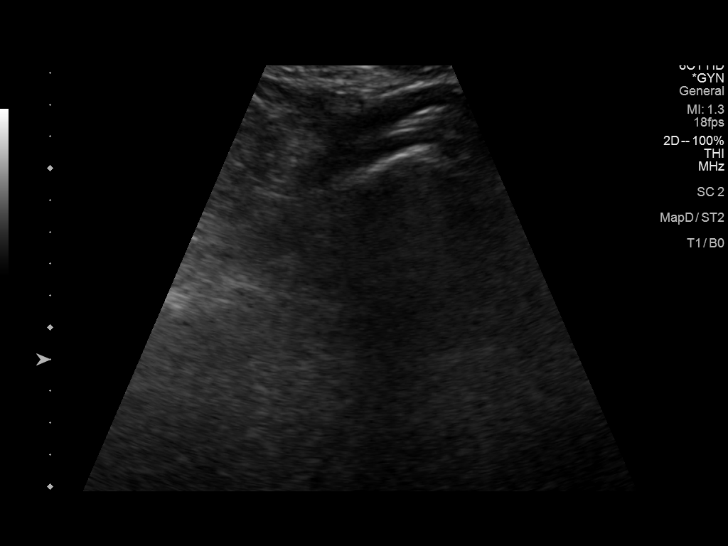
[im 15/45]
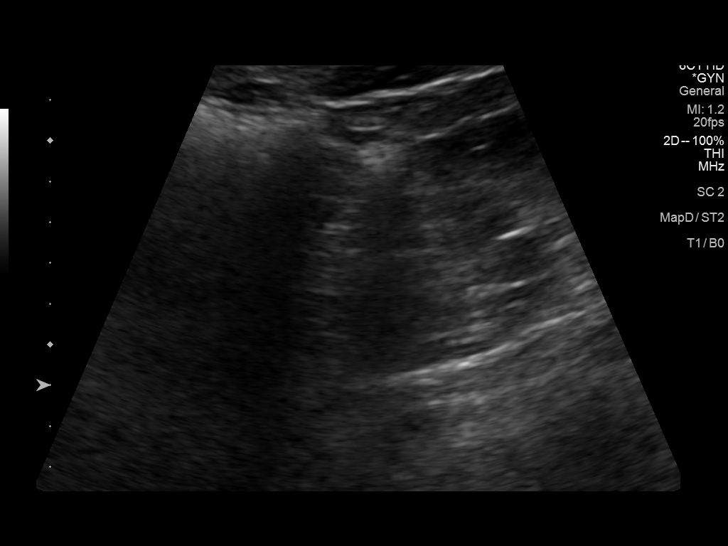
[im 17/45]
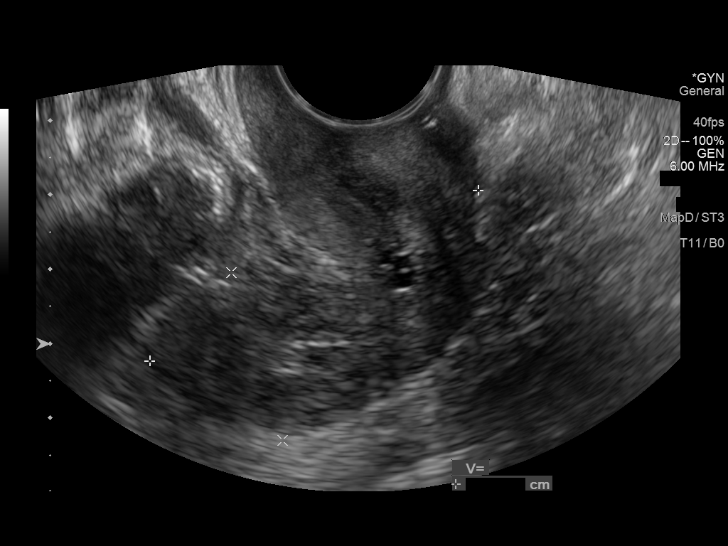
[im 21/45]
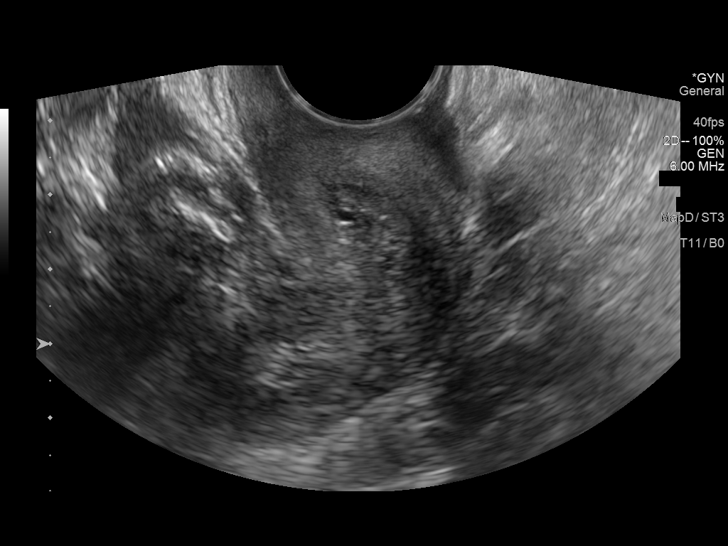
[im 24/45]
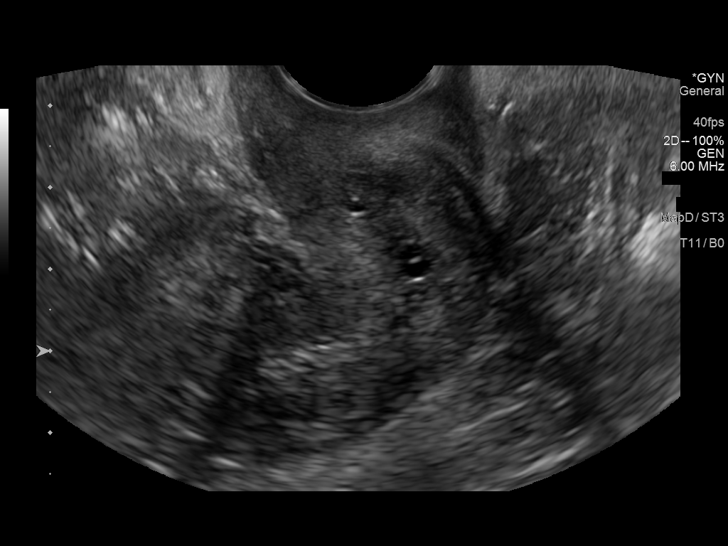
[im 28/45]
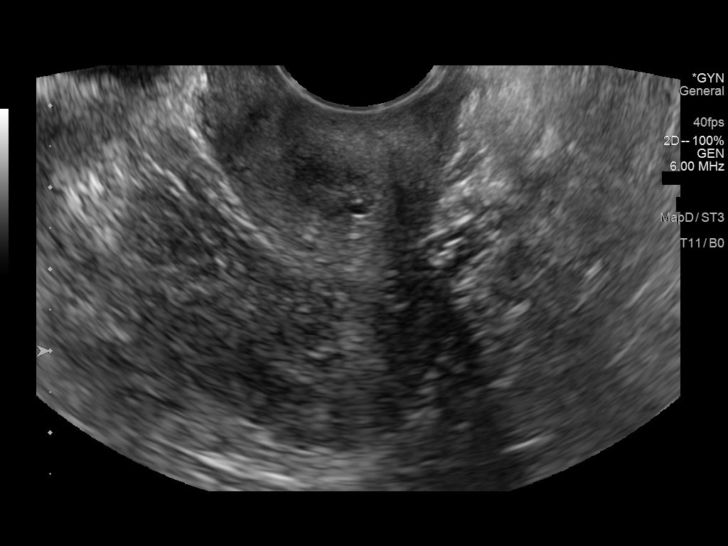
[im 30/45]
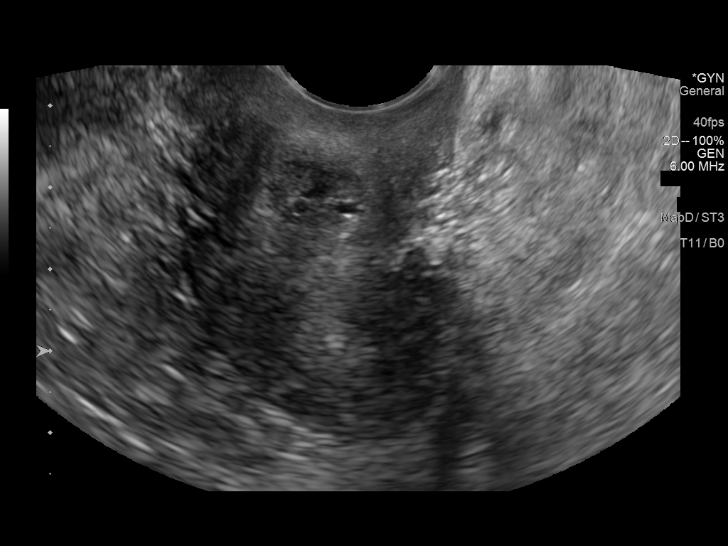
[im 34/45]
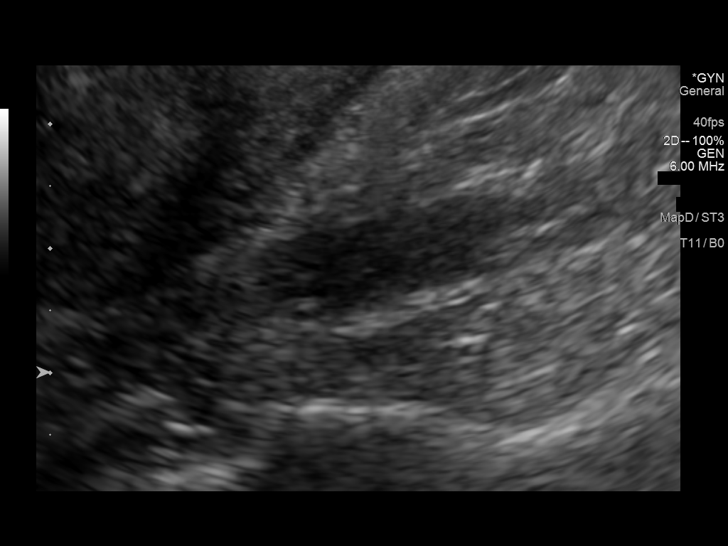
[im 37/45]
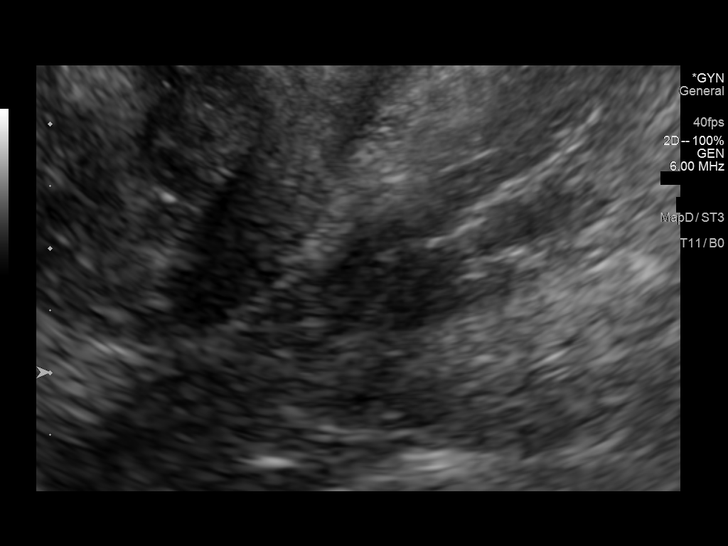
[im 41/45]
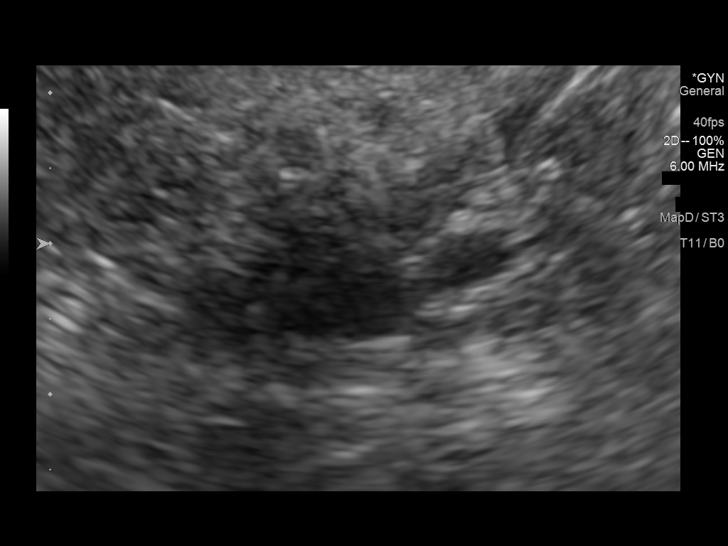
[im 45/45]
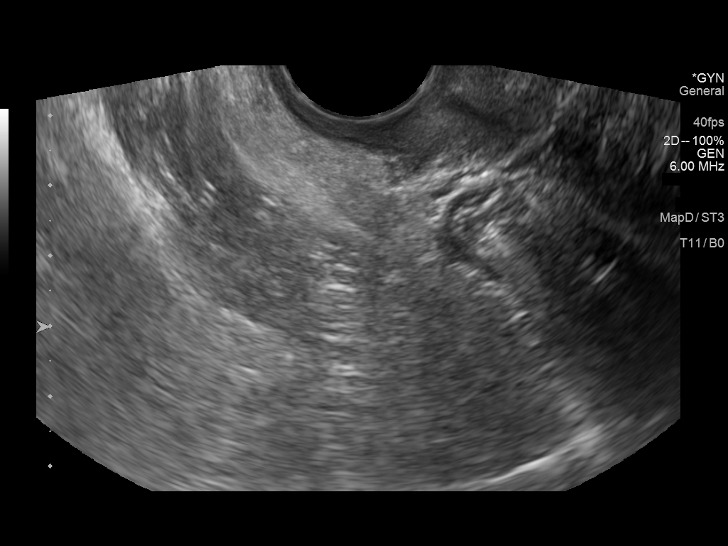

[14 of 25 positions shown; findings below may reference images not displayed]

FINDINGS: Uterus

Measurements: 5.0 x 2.4 x 3.3 cm = volume: 203 mL. Anteflexed.
Heterogeneous myometrium. No discrete mass.

Endometrium

Thickness: 3 mm.  No endometrial fluid or mass

Right ovary

Not visualized, likely obscured by bowel

Left ovary

Measurements: 1.4 x 0.8 x 1.0 cm = volume: 0.6 mL. Normal morphology
without mass

Other findings

No free pelvic fluid.  No adnexal masses.
IMPRESSION: Nonvisualization of RIGHT ovary.

Otherwise normal exam.

## 2023-11-26 DIAGNOSIS — M810 Age-related osteoporosis without current pathological fracture: Secondary | ICD-10-CM | POA: Diagnosis not present

## 2023-11-28 DIAGNOSIS — F4321 Adjustment disorder with depressed mood: Secondary | ICD-10-CM | POA: Diagnosis not present

## 2023-12-03 IMAGING — CT CT ABD-PELV W/ CM
1 of 3 series · 14 of 32 positions shown, 19 images · IV contrast (agent unspecified)
Comparison: Pelvic ultrasound 07/27/2021

CLINICAL DATA: Lower pelvic pain

EXAM:
CT ABDOMEN AND PELVIS WITH CONTRAST
TECHNIQUE: Multidetector CT imaging of the abdomen and pelvis was performed
using the standard protocol following bolus administration of
intravenous contrast.

[Series 2: a/p w/ 5mm · axial · 0.78mm/px · z∈[-454,-24]mm · 14 of 96 slices shown, 19 images]
[im 5/96  soft-tissue]
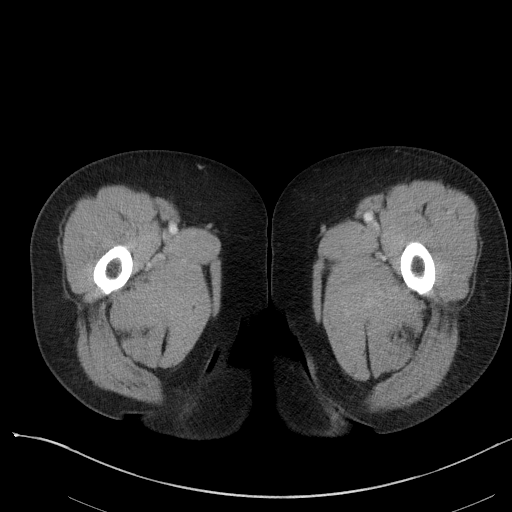
[im 5/96  bone]
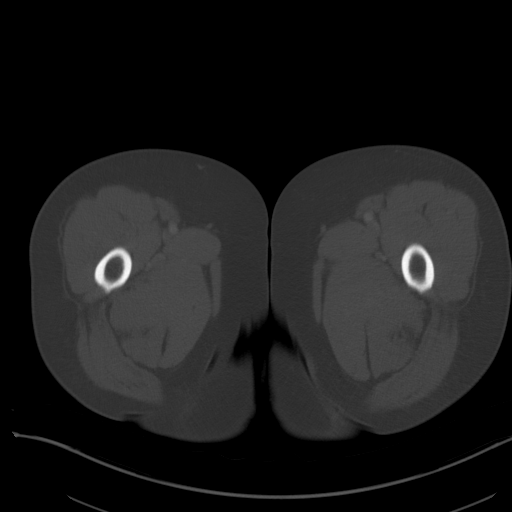
[im 15/96  soft-tissue]
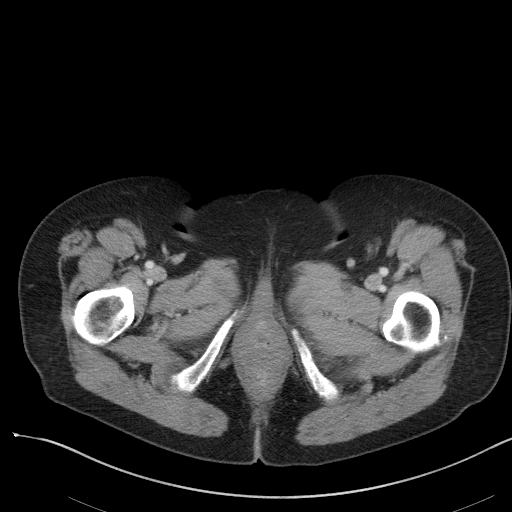
[im 20/96  soft-tissue]
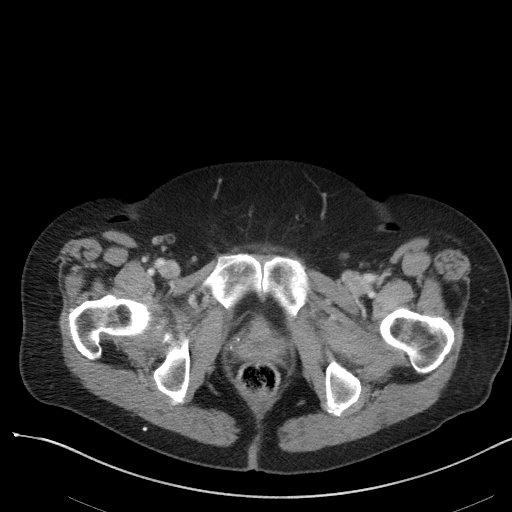
[im 29/96  soft-tissue]
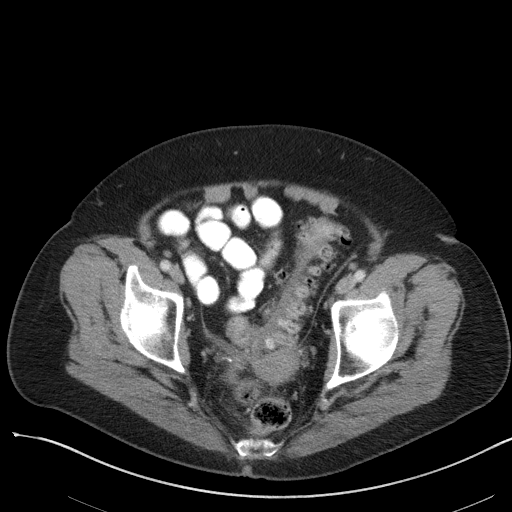
[im 34/96  soft-tissue]
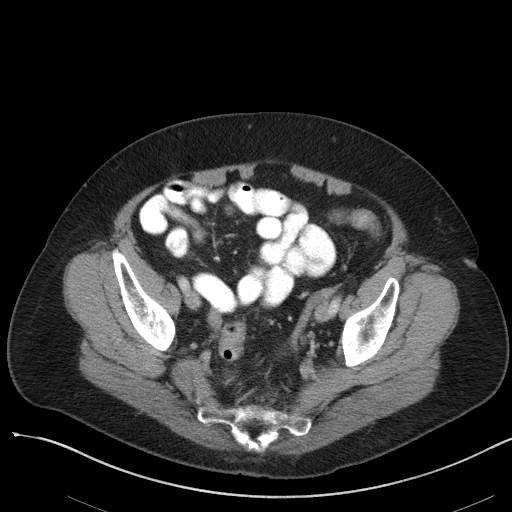
[im 43/96  soft-tissue]
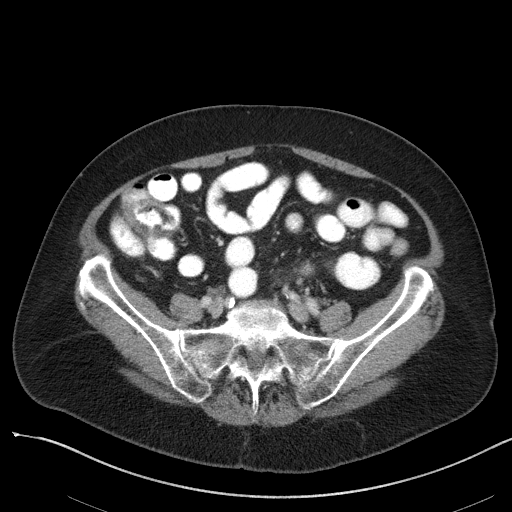
[im 48/96  soft-tissue]
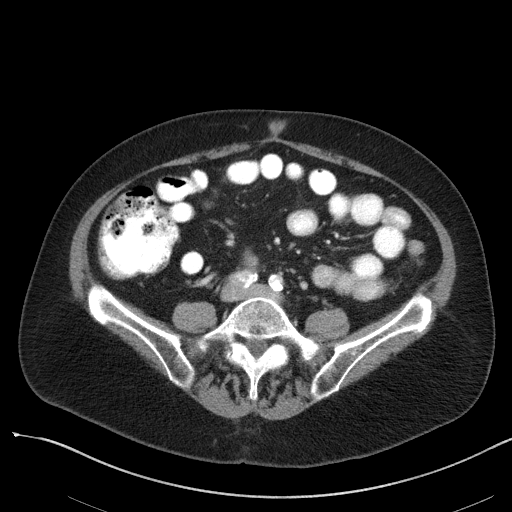
[im 53/96  soft-tissue]
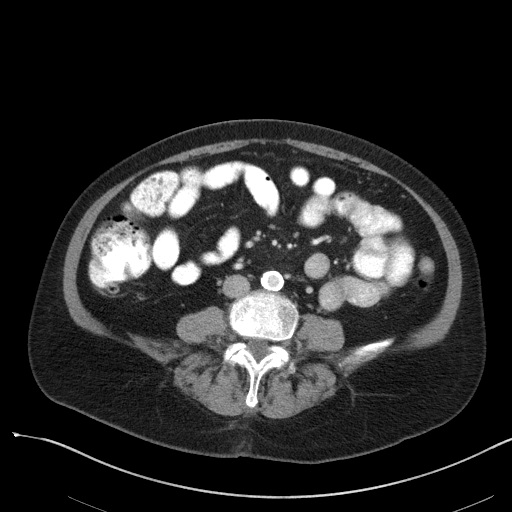
[im 62/96  soft-tissue]
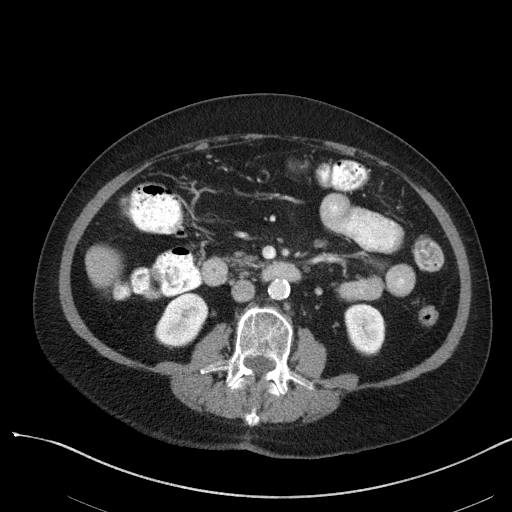
[im 62/96  bone]
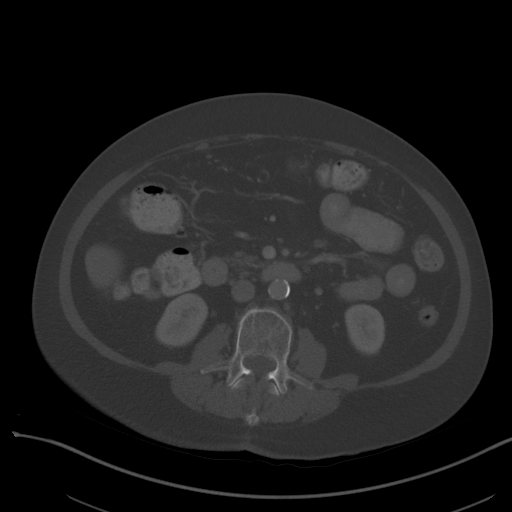
[im 67/96  soft-tissue]
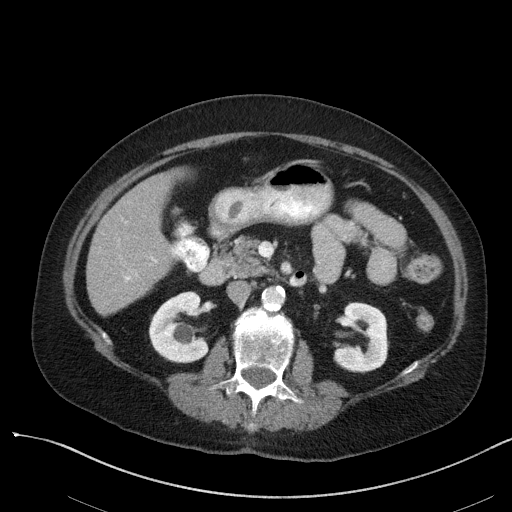
[im 77/96  soft-tissue]
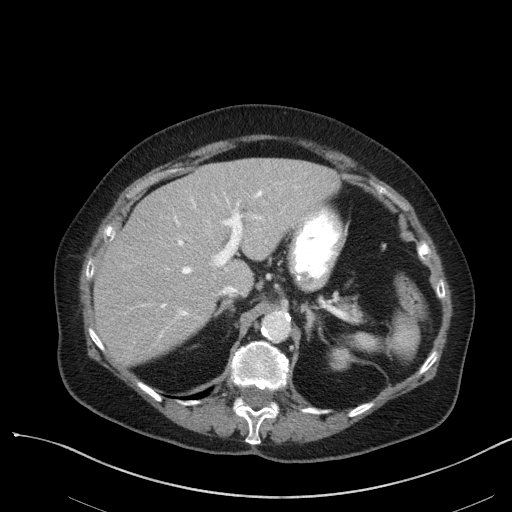
[im 77/96  lung]
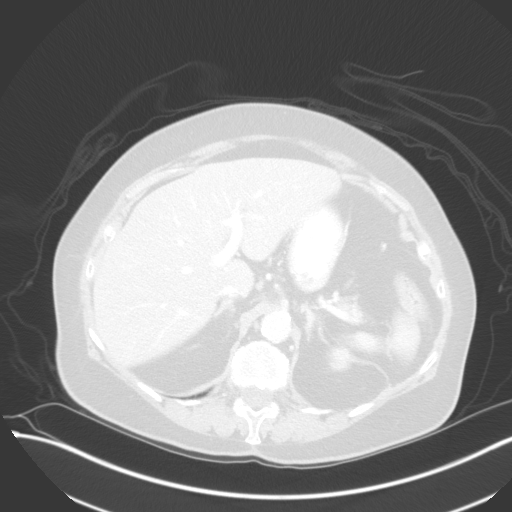
[im 81/96  soft-tissue]
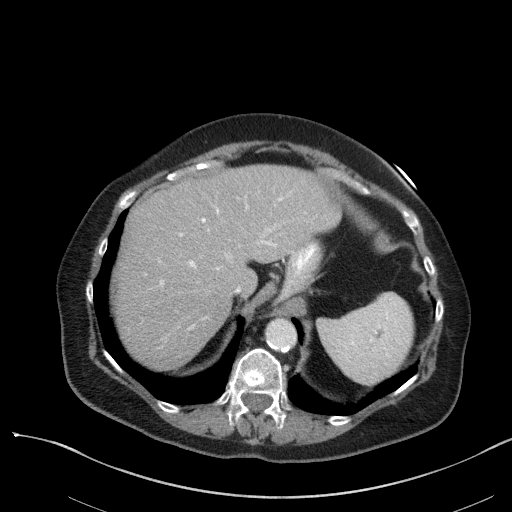
[im 81/96  lung]
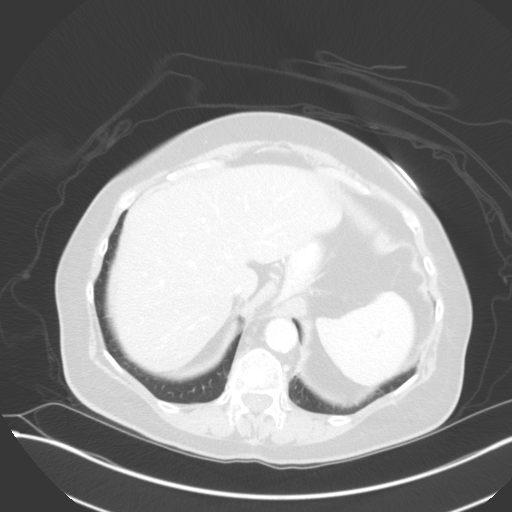
[im 86/96  lung]
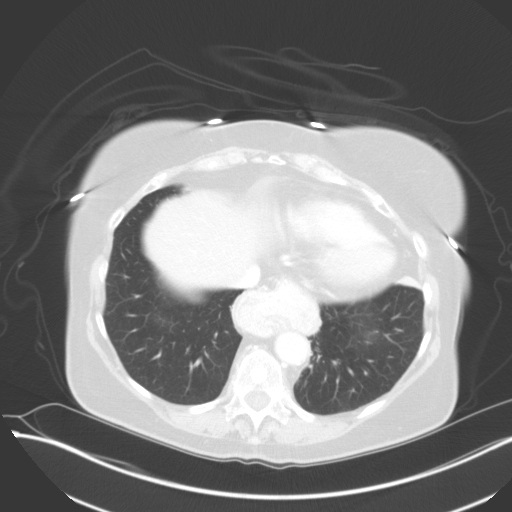
[im 91/96  soft-tissue]
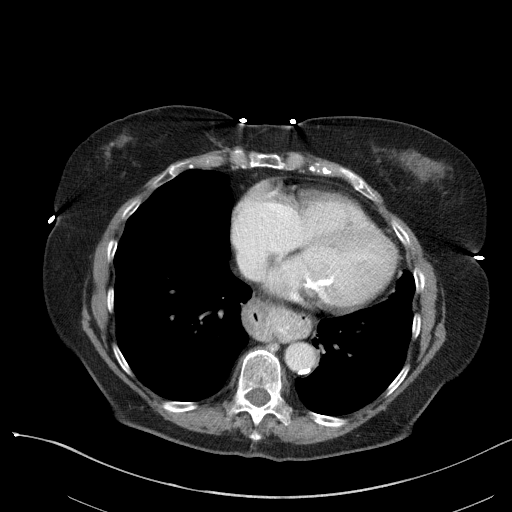
[im 91/96  lung]
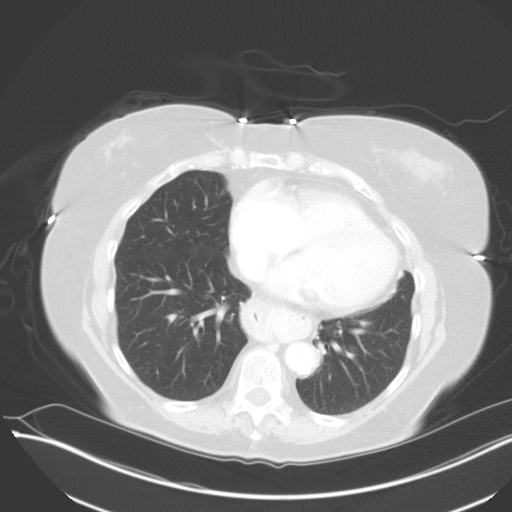

[14 of 32 positions shown; findings below may reference images not displayed]

RADIATION DOSE REDUCTION: This exam was performed according to the
departmental dose-optimization program which includes automated
exposure control, adjustment of the mA and/or kV according to
patient size and/or use of iterative reconstruction technique.

CONTRAST:  100mL AE65P9-UPP IOPAMIDOL (AE65P9-UPP) INJECTION 61%
FINDINGS: Lower chest: No acute abnormality.  Moderate hiatal hernia.

Hepatobiliary: No focal liver abnormality is seen. Status post
cholecystectomy. No biliary dilatation.

Pancreas: Unremarkable. No pancreatic ductal dilatation or
surrounding inflammatory changes.

Spleen: Normal in size without focal abnormality.

Adrenals/Urinary Tract: Adrenal glands are normal. Cyst midpole
right kidney, no follow-up imaging recommended. No hydronephrosis.
The bladder is unremarkable

Stomach/Bowel: Stomach nonenlarged. No dilated small bowel. Negative
appendix. Diverticular disease of the left colon. Mild wall
thickening at sigmoid colon without convincing pericolonic soft
tissue stranding.

Vascular/Lymphatic: Advanced aortic atherosclerosis. No aneurysm. No
suspicious lymph nodes.

Reproductive: Uterus and bilateral adnexa are unremarkable.

Other: Negative for pelvic effusion or free air.

Musculoskeletal: Chronic appearing mild compression deformities at
T12 and superior endplate of T11
IMPRESSION: 1. No definite CT evidence for acute intra-abdominal or pelvic
abnormality. Extensive diverticular disease of the sigmoid colon
with some wall thickening but no definite pericolonic inflammation.
2. Moderate hiatal hernia

## 2023-12-06 DIAGNOSIS — E559 Vitamin D deficiency, unspecified: Secondary | ICD-10-CM | POA: Diagnosis not present

## 2023-12-06 DIAGNOSIS — Z79899 Other long term (current) drug therapy: Secondary | ICD-10-CM | POA: Diagnosis not present

## 2023-12-06 DIAGNOSIS — Z0000 Encounter for general adult medical examination without abnormal findings: Secondary | ICD-10-CM | POA: Diagnosis not present

## 2023-12-06 DIAGNOSIS — E7800 Pure hypercholesterolemia, unspecified: Secondary | ICD-10-CM | POA: Diagnosis not present

## 2023-12-06 DIAGNOSIS — E039 Hypothyroidism, unspecified: Secondary | ICD-10-CM | POA: Diagnosis not present

## 2023-12-06 DIAGNOSIS — F4321 Adjustment disorder with depressed mood: Secondary | ICD-10-CM | POA: Diagnosis not present

## 2023-12-06 DIAGNOSIS — M810 Age-related osteoporosis without current pathological fracture: Secondary | ICD-10-CM | POA: Diagnosis not present

## 2023-12-06 DIAGNOSIS — Z23 Encounter for immunization: Secondary | ICD-10-CM | POA: Diagnosis not present

## 2023-12-06 DIAGNOSIS — R7303 Prediabetes: Secondary | ICD-10-CM | POA: Diagnosis not present

## 2023-12-06 DIAGNOSIS — D649 Anemia, unspecified: Secondary | ICD-10-CM | POA: Diagnosis not present

## 2023-12-06 DIAGNOSIS — I10 Essential (primary) hypertension: Secondary | ICD-10-CM | POA: Diagnosis not present

## 2023-12-06 DIAGNOSIS — Z8673 Personal history of transient ischemic attack (TIA), and cerebral infarction without residual deficits: Secondary | ICD-10-CM | POA: Diagnosis not present

## 2023-12-06 DIAGNOSIS — Z1331 Encounter for screening for depression: Secondary | ICD-10-CM | POA: Diagnosis not present

## 2023-12-13 DIAGNOSIS — M810 Age-related osteoporosis without current pathological fracture: Secondary | ICD-10-CM | POA: Diagnosis not present

## 2023-12-17 DIAGNOSIS — Z23 Encounter for immunization: Secondary | ICD-10-CM | POA: Diagnosis not present

## 2023-12-19 DIAGNOSIS — F4321 Adjustment disorder with depressed mood: Secondary | ICD-10-CM | POA: Diagnosis not present

## 2023-12-24 DIAGNOSIS — L601 Onycholysis: Secondary | ICD-10-CM | POA: Diagnosis not present

## 2023-12-26 DIAGNOSIS — H5213 Myopia, bilateral: Secondary | ICD-10-CM | POA: Diagnosis not present

## 2023-12-26 DIAGNOSIS — H524 Presbyopia: Secondary | ICD-10-CM | POA: Diagnosis not present

## 2023-12-26 DIAGNOSIS — H04123 Dry eye syndrome of bilateral lacrimal glands: Secondary | ICD-10-CM | POA: Diagnosis not present

## 2023-12-26 DIAGNOSIS — H52203 Unspecified astigmatism, bilateral: Secondary | ICD-10-CM | POA: Diagnosis not present

## 2023-12-26 DIAGNOSIS — H43813 Vitreous degeneration, bilateral: Secondary | ICD-10-CM | POA: Diagnosis not present

## 2023-12-26 DIAGNOSIS — Z961 Presence of intraocular lens: Secondary | ICD-10-CM | POA: Diagnosis not present

## 2023-12-26 DIAGNOSIS — H1789 Other corneal scars and opacities: Secondary | ICD-10-CM | POA: Diagnosis not present

## 2023-12-26 DIAGNOSIS — H353132 Nonexudative age-related macular degeneration, bilateral, intermediate dry stage: Secondary | ICD-10-CM | POA: Diagnosis not present

## 2024-01-02 DIAGNOSIS — F4321 Adjustment disorder with depressed mood: Secondary | ICD-10-CM | POA: Diagnosis not present

## 2024-01-02 DIAGNOSIS — G4733 Obstructive sleep apnea (adult) (pediatric): Secondary | ICD-10-CM | POA: Diagnosis not present

## 2024-02-06 DIAGNOSIS — F4321 Adjustment disorder with depressed mood: Secondary | ICD-10-CM | POA: Diagnosis not present

## 2024-02-06 DIAGNOSIS — D1801 Hemangioma of skin and subcutaneous tissue: Secondary | ICD-10-CM | POA: Diagnosis not present

## 2024-02-12 DIAGNOSIS — H10413 Chronic giant papillary conjunctivitis, bilateral: Secondary | ICD-10-CM | POA: Diagnosis not present

## 2024-02-21 DIAGNOSIS — H04123 Dry eye syndrome of bilateral lacrimal glands: Secondary | ICD-10-CM | POA: Diagnosis not present

## 2024-02-21 DIAGNOSIS — B0052 Herpesviral keratitis: Secondary | ICD-10-CM | POA: Diagnosis not present

## 2024-03-03 DIAGNOSIS — R399 Unspecified symptoms and signs involving the genitourinary system: Secondary | ICD-10-CM | POA: Diagnosis not present

## 2024-03-05 DIAGNOSIS — F4321 Adjustment disorder with depressed mood: Secondary | ICD-10-CM | POA: Diagnosis not present

## 2024-03-06 DIAGNOSIS — H04123 Dry eye syndrome of bilateral lacrimal glands: Secondary | ICD-10-CM | POA: Diagnosis not present

## 2024-03-06 DIAGNOSIS — B0052 Herpesviral keratitis: Secondary | ICD-10-CM | POA: Diagnosis not present
